# Patient Record
Sex: Female | Born: 1995 | Race: White | Hispanic: No | Smoking: Current every day smoker
Health system: Southern US, Community
[De-identification: ages and names within clinical notes are randomized; demographics above are authoritative.]

## PROBLEM LIST (undated history)

## (undated) ENCOUNTER — Inpatient Hospital Stay (HOSPITAL_COMMUNITY): Payer: Self-pay

## (undated) DIAGNOSIS — J45909 Unspecified asthma, uncomplicated: Secondary | ICD-10-CM

## (undated) DIAGNOSIS — F419 Anxiety disorder, unspecified: Secondary | ICD-10-CM

## (undated) DIAGNOSIS — M419 Scoliosis, unspecified: Secondary | ICD-10-CM

## (undated) HISTORY — DX: Scoliosis, unspecified: M41.9

## (undated) HISTORY — PX: COSMETIC SURGERY: SHX468

## (undated) HISTORY — PX: FRACTURE SURGERY: SHX138

## (undated) HISTORY — DX: Anxiety disorder, unspecified: F41.9

---
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
## 2014-06-16 LAB — HCG, QUANTITATIVE, PREGNANCY: HCG QUALITATIVE: 676.6

## 2014-08-08 NOTE — L&D Delivery Note (Cosign Needed)
Delivery Note At 1:15 PM a viable female was delivered via Vaginal, Spontaneous Delivery (Presentation: Left Occiput Anterior).  APGAR: 9, 9; weight 5 lb 11.2 oz (2586 g).  Loose nuchal cord, delivered through and reduced immediately after birth. Infant placed directly on mom's abdomen for bonding/skin-to-skin. Cord allowed to stop pulsing, and was clamped x 2, and cut by fob.  Very thin cord, didn't want to put any traction at all on it. Had pt push and then tried squatting in the bed w/ assistance while pushing, none of these attempts were successful in delivering placenta. Dr. Shawnie Pons was called for consult at s/p birth, recommended manual removal. Pt was given fentanyl iv x 1. Unsuccessful manual extraction attempt by me, then Dr. Shawnie Pons was able to successfully manual remove placenta, which came out in 3 pieces and looked to be complete.  Placenta status: Abnormal, Manual removal.  Cord: 3 vessels with the following complications: None.  Cord pH: n/a  Anesthesia: Epidural  Episiotomy: None Lacerations: 1st degree perineal, Lt labial Suture Repair: 3.0 vicryl Est. Blood Loss (mL):    Mom to postpartum.  Baby to Couplet care / Skin to Skin. Plans to breastfeed Condoms for contraception, doesn't want any form of hormonal contraception  [name] 03/07/2015, 2:41 PM

## 2014-08-15 ENCOUNTER — Inpatient Hospital Stay (HOSPITAL_COMMUNITY)
Admission: AD | Admit: 2014-08-15 | Payer: No Typology Code available for payment source | Source: Ambulatory Visit | Admitting: Obstetrics & Gynecology

## 2014-10-08 ENCOUNTER — Encounter: Payer: Self-pay | Admitting: *Deleted

## 2014-10-30 ENCOUNTER — Ambulatory Visit (INDEPENDENT_AMBULATORY_CARE_PROVIDER_SITE_OTHER): Payer: Medicaid Other | Admitting: Advanced Practice Midwife

## 2014-10-30 ENCOUNTER — Other Ambulatory Visit (HOSPITAL_COMMUNITY)
Admission: RE | Admit: 2014-10-30 | Discharge: 2014-10-30 | Disposition: A | Discharge status: Home / Self Care | Payer: Medicaid Other | Source: Ambulatory Visit | Attending: Advanced Practice Midwife | Admitting: Advanced Practice Midwife

## 2014-10-30 ENCOUNTER — Encounter: Payer: Self-pay | Admitting: Advanced Practice Midwife

## 2014-10-30 VITALS — BP 131/79 | HR 100 | Ht 60.0 in | Wt 192.1 lb

## 2014-10-30 DIAGNOSIS — Z124 Encounter for screening for malignant neoplasm of cervix: Secondary | ICD-10-CM

## 2014-10-30 DIAGNOSIS — M419 Scoliosis, unspecified: Secondary | ICD-10-CM

## 2014-10-30 DIAGNOSIS — Z113 Encounter for screening for infections with a predominantly sexual mode of transmission: Secondary | ICD-10-CM | POA: Insufficient documentation

## 2014-10-30 DIAGNOSIS — Z01419 Encounter for gynecological examination (general) (routine) without abnormal findings: Secondary | ICD-10-CM

## 2014-10-30 DIAGNOSIS — F410 Panic disorder [episodic paroxysmal anxiety] without agoraphobia: Secondary | ICD-10-CM

## 2014-10-30 DIAGNOSIS — F419 Anxiety disorder, unspecified: Secondary | ICD-10-CM | POA: Insufficient documentation

## 2014-10-30 DIAGNOSIS — Z118 Encounter for screening for other infectious and parasitic diseases: Secondary | ICD-10-CM

## 2014-10-30 DIAGNOSIS — T148XXA Other injury of unspecified body region, initial encounter: Secondary | ICD-10-CM

## 2014-10-30 DIAGNOSIS — O0932 Supervision of pregnancy with insufficient antenatal care, second trimester: Secondary | ICD-10-CM | POA: Insufficient documentation

## 2014-10-30 DIAGNOSIS — M1990 Unspecified osteoarthritis, unspecified site: Secondary | ICD-10-CM | POA: Insufficient documentation

## 2014-10-30 DIAGNOSIS — J45909 Unspecified asthma, uncomplicated: Secondary | ICD-10-CM | POA: Insufficient documentation

## 2014-10-30 LAB — POCT URINALYSIS DIP (DEVICE)
Bilirubin Urine: NEGATIVE
Glucose, UA: NEGATIVE mg/dL
HGB URINE DIPSTICK: NEGATIVE
Ketones, ur: NEGATIVE mg/dL
LEUKOCYTES UA: NEGATIVE
Nitrite: NEGATIVE
PH: 6 (ref 5.0–8.0)
PROTEIN: NEGATIVE mg/dL
SPECIFIC GRAVITY, URINE: 1.02 (ref 1.005–1.030)
Urobilinogen, UA: 0.2 mg/dL (ref 0.0–1.0)

## 2014-10-30 MED ORDER — HYDROXYZINE HCL 25 MG PO TABS: 25.0000 mg | ORAL_TABLET | Freq: Four times a day (QID) | ORAL | Status: DC | PRN

## 2014-10-30 NOTE — Progress Notes (Signed)
Pt reports bruising. Needs to return for early glucola.

## 2014-10-30 NOTE — Patient Instructions (Signed)
Second Trimester of Pregnancy The second trimester is from week 13 through week 28, months 4 through 6. The second trimester is often a time when you feel your best. Your body has also adjusted to being pregnant, and you begin to feel better physically. Usually, morning sickness has lessened or quit completely, you may have more energy, and you may have an increase in appetite. The second trimester is also a time when the fetus is growing rapidly. At the end of the sixth month, the fetus is about 9 inches long and weighs about 1 pounds. You will likely begin to feel the baby move (quickening) between 18 and 20 weeks of the pregnancy. BODY CHANGES Your body goes through many changes during pregnancy. The changes vary from woman to woman.   Your weight will continue to increase. You will notice your lower abdomen bulging out.  You may begin to get stretch marks on your hips, abdomen, and breasts.  You may develop headaches that can be relieved by medicines approved by your health care provider.  You may urinate more often because the fetus is pressing on your bladder.  You may develop or continue to have heartburn as a result of your pregnancy.  You may develop constipation because certain hormones are causing the muscles that push waste through your intestines to slow down.  You may develop hemorrhoids or swollen, bulging veins (varicose veins).  You may have back pain because of the weight gain and pregnancy hormones relaxing your joints between the bones in your pelvis and as a result of a shift in weight and the muscles that support your balance.  Your breasts will continue to grow and be tender.  Your gums may bleed and may be sensitive to brushing and flossing.  Dark spots or blotches (chloasma, mask of pregnancy) may develop on your face. This will likely fade after the baby is born.  A dark line from your belly button to the pubic area (linea nigra) may appear. This will likely fade  after the baby is born.  You may have changes in your hair. These can include thickening of your hair, rapid growth, and changes in texture. Some women also have hair loss during or after pregnancy, or hair that feels dry or thin. Your hair will most likely return to normal after your baby is born. WHAT TO EXPECT AT YOUR PRENATAL VISITS During a routine prenatal visit:  You will be weighed to make sure you and the fetus are growing normally.  Your blood pressure will be taken.  Your abdomen will be measured to track your baby's growth.  The fetal heartbeat will be listened to.  Any test results from the previous visit will be discussed. Your health care provider may ask you:  How you are feeling.  If you are feeling the baby move.  If you have had any abnormal symptoms, such as leaking fluid, bleeding, severe headaches, or abdominal cramping.  If you have any questions. Other tests that may be performed during your second trimester include:  Blood tests that check for:  Low iron levels (anemia).  Gestational diabetes (between 24 and 28 weeks).  Rh antibodies.  Urine tests to check for infections, diabetes, or protein in the urine.  An ultrasound to confirm the proper growth and development of the baby.  An amniocentesis to check for possible genetic problems.  Fetal screens for spina bifida and Down syndrome. HOME CARE INSTRUCTIONS   Avoid all smoking, herbs, alcohol, and unprescribed   drugs. These chemicals affect the formation and growth of the baby.  Follow your health care provider's instructions regarding medicine use. There are medicines that are either safe or unsafe to take during pregnancy.  Exercise only as directed by your health care provider. Experiencing uterine cramps is a good sign to stop exercising.  Continue to eat regular, healthy meals.  Wear a good support bra for breast tenderness.  Do not use hot tubs, steam rooms, or saunas.  Wear your  seat belt at all times when driving.  Avoid raw meat, uncooked cheese, cat litter boxes, and soil used by cats. These carry germs that can cause birth defects in the baby.  Take your prenatal vitamins.  Try taking a stool softener (if your health care provider approves) if you develop constipation. Eat more high-fiber foods, such as fresh vegetables or fruit and whole grains. Drink plenty of fluids to keep your urine clear or pale yellow.  Take warm sitz baths to soothe any pain or discomfort caused by hemorrhoids. Use hemorrhoid cream if your health care provider approves.  If you develop varicose veins, wear support hose. Elevate your feet for 15 minutes, 3-4 times a day. Limit salt in your diet.  Avoid heavy lifting, wear low heel shoes, and practice good posture.  Rest with your legs elevated if you have leg cramps or low back pain.  Visit your dentist if you have not gone yet during your pregnancy. Use a soft toothbrush to brush your teeth and be gentle when you floss.  A sexual relationship may be continued unless your health care provider directs you otherwise.  Continue to go to all your prenatal visits as directed by your health care provider. SEEK MEDICAL CARE IF:   You have dizziness.  You have mild pelvic cramps, pelvic pressure, or nagging pain in the abdominal area.  You have persistent nausea, vomiting, or diarrhea.  You have a bad smelling vaginal discharge.  You have pain with urination. SEEK IMMEDIATE MEDICAL CARE IF:   You have a fever.  You are leaking fluid from your vagina.  You have spotting or bleeding from your vagina.  You have severe abdominal cramping or pain.  You have rapid weight gain or loss.  You have shortness of breath with chest pain.  You notice sudden or extreme swelling of your face, hands, ankles, feet, or legs.  You have not felt your baby move in over an hour.  You have severe headaches that do not go away with  medicine.  You have vision changes. Document Released: 07/19/2001 Document Revised: 07/30/2013 Document Reviewed: 09/25/2012 Exit[employer] Patient Information 2015 [employer]. This information is not intended to replace advice given to you by your health care provider. Make sure you discuss any questions you have with your health care provider.  
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
## 2014-10-31 LAB — PRENATAL PROFILE (SOLSTAS)
Antibody Screen: NEGATIVE
BASOS PCT: 0 % (ref 0–1)
Basophils Absolute: 0 10*3/uL (ref 0.0–0.1)
EOS ABS: 0.5 10*3/uL (ref 0.0–0.7)
Eosinophils Relative: 3 % (ref 0–5)
HEMATOCRIT: 38.4 % (ref 36.0–46.0)
HEMOGLOBIN: 13.1 g/dL (ref 12.0–15.0)
HEP B S AG: NEGATIVE
HIV 1&2 Ab, 4th Generation: NONREACTIVE
LYMPHS ABS: 2.4 10*3/uL (ref 0.7–4.0)
Lymphocytes Relative: 15 % (ref 12–46)
MCH: 29 pg (ref 26.0–34.0)
MCHC: 34.1 g/dL (ref 30.0–36.0)
MCV: 85 fL (ref 78.0–100.0)
MONO ABS: 0.8 10*3/uL (ref 0.1–1.0)
MONOS PCT: 5 % (ref 3–12)
MPV: 10.3 fL (ref 8.6–12.4)
NEUTROS PCT: 77 % (ref 43–77)
Neutro Abs: 12.4 10*3/uL — ABNORMAL HIGH (ref 1.7–7.7)
Platelets: 359 10*3/uL (ref 150–400)
RBC: 4.52 MIL/uL (ref 3.87–5.11)
RDW: 14.7 % (ref 11.5–15.5)
RH TYPE: NEGATIVE
RUBELLA: 2.65 {index} — AB (ref <0.90)
WBC: 16.1 10*3/uL — ABNORMAL HIGH (ref 4.0–10.5)

## 2014-10-31 LAB — CULTURE, OB URINE
Colony Count: NO GROWTH
Organism ID, Bacteria: NO GROWTH

## 2014-11-01 LAB — PRESCRIPTION MONITORING PROFILE (19 PANEL)
Amphetamine/Meth: NEGATIVE ng/mL
BUPRENORPHINE, URINE: NEGATIVE ng/mL
Barbiturate Screen, Urine: NEGATIVE ng/mL
Benzodiazepine Screen, Urine: NEGATIVE ng/mL
CANNABINOID SCRN UR: NEGATIVE ng/mL
Carisoprodol, Urine: NEGATIVE ng/mL
Cocaine Metabolites: NEGATIVE ng/mL
Creatinine, Urine: 109.29 mg/dL (ref >20.0)
ECSTASY: NEGATIVE ng/mL
FENTANYL URINE: NEGATIVE ng/mL
METHADONE SCREEN, URINE: NEGATIVE ng/mL
Meperidine, Ur: NEGATIVE ng/mL
Methaqualone: NEGATIVE ng/mL
Nitrites, Initial: NEGATIVE ug/mL
OPIATE SCREEN, URINE: NEGATIVE ng/mL
OXYCODONE SCRN UR: NEGATIVE ng/mL
PHENCYCLIDINE, UR: NEGATIVE ng/mL
Propoxyphene: NEGATIVE ng/mL
TAPENTADOLUR: NEGATIVE ng/mL
TRAMADOL UR: NEGATIVE ng/mL
Zolpidem, Urine: NEGATIVE ng/mL
pH, Initial: 5.5 pH (ref 4.5–8.9)

## 2014-11-03 ENCOUNTER — Encounter (HOSPITAL_COMMUNITY): Payer: Self-pay | Admitting: Advanced Practice Midwife

## 2014-11-03 DIAGNOSIS — O26899 Other specified pregnancy related conditions, unspecified trimester: Secondary | ICD-10-CM

## 2014-11-03 DIAGNOSIS — Z6791 Unspecified blood type, Rh negative: Secondary | ICD-10-CM | POA: Insufficient documentation

## 2014-11-03 LAB — AFP, QUAD SCREEN
AFP: 98.5 ng/mL
CURR GEST AGE: 22.3 wks.days
HCG, Total: 25.47 IU/mL
INH: 493.4 pg/mL
Interpretation-AFP: NEGATIVE
MOM FOR INH: 2.39
MoM for AFP: 1.42
MoM for hCG: 1.46
Open Spina bifida: NEGATIVE
Osb Risk: 1:3470 {titer}
Tri 18 Scr Risk Est: NEGATIVE
Trisomy 18 (Edward) Syndrome Interp.: 1:149000 {titer}
uE3 Mom: 0.94
uE3 Value: 2.31 ng/mL

## 2014-11-04 LAB — CYTOLOGY - PAP

## 2014-11-07 ENCOUNTER — Ambulatory Visit (HOSPITAL_COMMUNITY)
Admission: RE | Admit: 2014-11-07 | Discharge: 2014-11-07 | Disposition: A | Discharge status: Home / Self Care | Payer: Medicaid Other | Source: Ambulatory Visit | Attending: Advanced Practice Midwife | Admitting: Advanced Practice Midwife

## 2014-11-07 ENCOUNTER — Other Ambulatory Visit: Payer: Self-pay | Admitting: Advanced Practice Midwife

## 2014-11-07 DIAGNOSIS — O0932 Supervision of pregnancy with insufficient antenatal care, second trimester: Secondary | ICD-10-CM | POA: Insufficient documentation

## 2014-11-07 DIAGNOSIS — Z3689 Encounter for other specified antenatal screening: Secondary | ICD-10-CM | POA: Insufficient documentation

## 2014-11-07 DIAGNOSIS — O0930 Supervision of pregnancy with insufficient antenatal care, unspecified trimester: Secondary | ICD-10-CM | POA: Insufficient documentation

## 2014-11-07 DIAGNOSIS — O99212 Obesity complicating pregnancy, second trimester: Secondary | ICD-10-CM | POA: Insufficient documentation

## 2014-11-27 ENCOUNTER — Encounter: Payer: Self-pay | Admitting: *Deleted

## 2014-11-27 ENCOUNTER — Ambulatory Visit (INDEPENDENT_AMBULATORY_CARE_PROVIDER_SITE_OTHER): Payer: Medicaid Other | Admitting: Advanced Practice Midwife

## 2014-11-27 VITALS — BP 128/68 | HR 106 | Temp 98.6°F | Wt 198.5 lb

## 2014-11-27 DIAGNOSIS — O9989 Other specified diseases and conditions complicating pregnancy, childbirth and the puerperium: Secondary | ICD-10-CM

## 2014-11-27 DIAGNOSIS — Z3492 Encounter for supervision of normal pregnancy, unspecified, second trimester: Secondary | ICD-10-CM

## 2014-11-27 DIAGNOSIS — J45909 Unspecified asthma, uncomplicated: Secondary | ICD-10-CM

## 2014-11-27 LAB — POCT URINALYSIS DIP (DEVICE)
BILIRUBIN URINE: NEGATIVE
GLUCOSE, UA: NEGATIVE mg/dL
Hgb urine dipstick: NEGATIVE
KETONES UR: NEGATIVE mg/dL
Nitrite: NEGATIVE
Protein, ur: NEGATIVE mg/dL
SPECIFIC GRAVITY, URINE: 1.015 (ref 1.005–1.030)
Urobilinogen, UA: 0.2 mg/dL (ref 0.0–1.0)
pH: 7 (ref 5.0–8.0)

## 2014-11-27 NOTE — Progress Notes (Signed)
Pt is currently taking steroids for asthma, needs to do 28 wk labs\ Leukocytes: Trace

## 2014-11-27 NOTE — Progress Notes (Signed)
Per perinatal education, patient has completed the Waterbirth Class.

## 2014-11-28 NOTE — Patient Instructions (Signed)
Second Trimester of Pregnancy The second trimester is from week 13 through week 28, months 4 through 6. The second trimester is often a time when you feel your best. Your body has also adjusted to being pregnant, and you begin to feel better physically. Usually, morning sickness has lessened or quit completely, you may have more energy, and you may have an increase in appetite. The second trimester is also a time when the fetus is growing rapidly. At the end of the sixth month, the fetus is about 9 inches long and weighs about 1 pounds. You will likely begin to feel the baby move (quickening) between 18 and 20 weeks of the pregnancy. BODY CHANGES Your body goes through many changes during pregnancy. The changes vary from woman to woman.   Your weight will continue to increase. You will notice your lower abdomen bulging out.  You may begin to get stretch marks on your hips, abdomen, and breasts.  You may develop headaches that can be relieved by medicines approved by your health care provider.  You may urinate more often because the fetus is pressing on your bladder.  You may develop or continue to have heartburn as a result of your pregnancy.  You may develop constipation because certain hormones are causing the muscles that push waste through your intestines to slow down.  You may develop hemorrhoids or swollen, bulging veins (varicose veins).  You may have back pain because of the weight gain and pregnancy hormones relaxing your joints between the bones in your pelvis and as a result of a shift in weight and the muscles that support your balance.  Your breasts will continue to grow and be tender.  Your gums may bleed and may be sensitive to brushing and flossing.  Dark spots or blotches (chloasma, mask of pregnancy) may develop on your face. This will likely fade after the baby is born.  A dark line from your belly button to the pubic area (linea nigra) may appear. This will likely fade  after the baby is born.  You may have changes in your hair. These can include thickening of your hair, rapid growth, and changes in texture. Some women also have hair loss during or after pregnancy, or hair that feels dry or thin. Your hair will most likely return to normal after your baby is born. WHAT TO EXPECT AT YOUR PRENATAL VISITS During a routine prenatal visit:  You will be weighed to make sure you and the fetus are growing normally.  Your blood pressure will be taken.  Your abdomen will be measured to track your baby's growth.  The fetal heartbeat will be listened to.  Any test results from the previous visit will be discussed. Your health care provider may ask you:  How you are feeling.  If you are feeling the baby move.  If you have had any abnormal symptoms, such as leaking fluid, bleeding, severe headaches, or abdominal cramping.  If you have any questions. Other tests that may be performed during your second trimester include:  Blood tests that check for:  Low iron levels (anemia).  Gestational diabetes (between 24 and 28 weeks).  Rh antibodies.  Urine tests to check for infections, diabetes, or protein in the urine.  An ultrasound to confirm the proper growth and development of the baby.  An amniocentesis to check for possible genetic problems.  Fetal screens for spina bifida and Down syndrome. HOME CARE INSTRUCTIONS   Avoid all smoking, herbs, alcohol, and unprescribed   drugs. These chemicals affect the formation and growth of the baby.  Follow your health care provider's instructions regarding medicine use. There are medicines that are either safe or unsafe to take during pregnancy.  Exercise only as directed by your health care provider. Experiencing uterine cramps is a good sign to stop exercising.  Continue to eat regular, healthy meals.  Wear a good support bra for breast tenderness.  Do not use hot tubs, steam rooms, or saunas.  Wear your  seat belt at all times when driving.  Avoid raw meat, uncooked cheese, cat litter boxes, and soil used by cats. These carry germs that can cause birth defects in the baby.  Take your prenatal vitamins.  Try taking a stool softener (if your health care provider approves) if you develop constipation. Eat more high-fiber foods, such as fresh vegetables or fruit and whole grains. Drink plenty of fluids to keep your urine clear or pale yellow.  Take warm sitz baths to soothe any pain or discomfort caused by hemorrhoids. Use hemorrhoid cream if your health care provider approves.  If you develop varicose veins, wear support hose. Elevate your feet for 15 minutes, 3-4 times a day. Limit salt in your diet.  Avoid heavy lifting, wear low heel shoes, and practice good posture.  Rest with your legs elevated if you have leg cramps or low back pain.  Visit your dentist if you have not gone yet during your pregnancy. Use a soft toothbrush to brush your teeth and be gentle when you floss.  A sexual relationship may be continued unless your health care provider directs you otherwise.  Continue to go to all your prenatal visits as directed by your health care provider. SEEK MEDICAL CARE IF:   You have dizziness.  You have mild pelvic cramps, pelvic pressure, or nagging pain in the abdominal area.  You have persistent nausea, vomiting, or diarrhea.  You have a bad smelling vaginal discharge.  You have pain with urination. SEEK IMMEDIATE MEDICAL CARE IF:   You have a fever.  You are leaking fluid from your vagina.  You have spotting or bleeding from your vagina.  You have severe abdominal cramping or pain.  You have rapid weight gain or loss.  You have shortness of breath with chest pain.  You notice sudden or extreme swelling of your face, hands, ankles, feet, or legs.  You have not felt your baby move in over an hour.  You have severe headaches that do not go away with  medicine.  You have vision changes. Document Released: 07/19/2001 Document Revised: 07/30/2013 Document Reviewed: 09/25/2012 Exit[employer] Patient Information 2015 [employer]. This information is not intended to replace advice given to you by your health care provider. Make sure you discuss any questions you have with your health care provider.  

## 2014-11-28 NOTE — Progress Notes (Signed)
Seen for asthma yesterday, states got steroid shots and prescribed DuoNeb. Has Rx for PO steroids, but has not started them yet. Reinforced followup for asthma. Needs Glucola at next visit. Need to be off steroids 1+ weeks prior to test. No wheezing now.

## 2014-12-03 ENCOUNTER — Inpatient Hospital Stay (HOSPITAL_COMMUNITY)
Admission: AD | Admit: 2014-12-03 | Discharge: 2014-12-03 | Disposition: A | Discharge status: Home / Self Care | Payer: Medicaid Other | Source: Ambulatory Visit | Attending: Obstetrics & Gynecology | Admitting: Obstetrics & Gynecology

## 2014-12-03 ENCOUNTER — Encounter (HOSPITAL_COMMUNITY): Payer: Self-pay | Admitting: *Deleted

## 2014-12-03 DIAGNOSIS — O9989 Other specified diseases and conditions complicating pregnancy, childbirth and the puerperium: Secondary | ICD-10-CM | POA: Insufficient documentation

## 2014-12-03 DIAGNOSIS — O99332 Smoking (tobacco) complicating pregnancy, second trimester: Secondary | ICD-10-CM | POA: Insufficient documentation

## 2014-12-03 DIAGNOSIS — N898 Other specified noninflammatory disorders of vagina: Secondary | ICD-10-CM

## 2014-12-03 DIAGNOSIS — R197 Diarrhea, unspecified: Secondary | ICD-10-CM | POA: Insufficient documentation

## 2014-12-03 DIAGNOSIS — F17210 Nicotine dependence, cigarettes, uncomplicated: Secondary | ICD-10-CM | POA: Diagnosis not present

## 2014-12-03 DIAGNOSIS — R109 Unspecified abdominal pain: Secondary | ICD-10-CM

## 2014-12-03 DIAGNOSIS — Z3A27 27 weeks gestation of pregnancy: Secondary | ICD-10-CM | POA: Insufficient documentation

## 2014-12-03 LAB — URINALYSIS, ROUTINE W REFLEX MICROSCOPIC
BILIRUBIN URINE: NEGATIVE
GLUCOSE, UA: NEGATIVE mg/dL
HGB URINE DIPSTICK: NEGATIVE
Ketones, ur: NEGATIVE mg/dL
Leukocytes, UA: NEGATIVE
Nitrite: NEGATIVE
Protein, ur: NEGATIVE mg/dL
SPECIFIC GRAVITY, URINE: 1.01 (ref 1.005–1.030)
Urobilinogen, UA: 0.2 mg/dL (ref 0.0–1.0)
pH: 5.5 (ref 5.0–8.0)

## 2014-12-03 LAB — WET PREP, GENITAL
CLUE CELLS WET PREP: NONE SEEN
Trich, Wet Prep: NONE SEEN
Yeast Wet Prep HPF POC: NONE SEEN

## 2014-12-03 NOTE — MAU Note (Signed)
Pt reports diarrhea and cramping x one week. States 3-4 loose stools per day. Denies fever. Also reports white discharge and states she feels "really wet" also the time.

## 2014-12-03 NOTE — MAU Provider Note (Signed)
History     CSN: [id number]  Arrival date and time: 12/03/14 4098   First Provider Initiated Contact with Patient 12/03/14 2020     Chief Complaint  Patient presents with  . Rupture of Membranes  . Diarrhea  . Abdominal Cramping  . Vaginal Discharge   HPI  Patient is 19 y.o. G1P0 [redacted]w[redacted]d here with complaints of diarrhea and cramping about 2 weeks ago.  Going to the bathroom no more than 2 times daily.  Denies fevers, chills, nausea, vomiting, cough, congestion, sore throat, runny nose.  Endorses asthma for which she takes albuterol and is on a short course of steroids.  Endorses vaginal discharge x1 week.  Denies odors or vaginal itching.  Denies undercooked foods, petting zoos, travel, sick contacts, hematochezia, rectal pain, postprandial pain.  Patient stays well hydrated.         +FM, denies LOF, VB, contractions  OB History    Gravida Para Term Preterm AB TAB SAB Ectopic Multiple Living   1               Past Medical History  Diagnosis Date  . Asthma   . Scoliosis     Past Surgical History  Procedure Laterality Date  . Fracture surgery    . Cosmetic surgery      Family History  Problem Relation Age of Onset  . Hypertension Father     History  Substance Use Topics  . Smoking status: Current Every Day Smoker -- 0.25 packs/day    Types: Cigarettes  . Smokeless tobacco: Never Used  . Alcohol Use: No    Allergies:  Allergies  Allergen Reactions  . Peanuts [Peanut Oil] Swelling  . Prednisone Other (See Comments)    Causes hostility.    Prescriptions prior to admission  Medication Sig Dispense Refill Last Dose  . albuterol (PROVENTIL HFA;VENTOLIN HFA) 108 (90 BASE) MCG/ACT inhaler Inhale 2 puffs into the lungs every 4 (four) hours as needed for wheezing or shortness of breath.   Past Week at Unknown time  . albuterol (PROVENTIL) (5 MG/ML) 0.5% nebulizer solution Take 2.5 mg by nebulization every 6 (six) hours as needed for wheezing or shortness of breath.    12/02/2014 at Unknown time  . diphenhydrAMINE (BENADRYL) 25 MG tablet Take 25 mg by mouth daily as needed for allergies.   12/02/2014 at Unknown time  . Fluticasone Furoate-Vilanterol 100-25 MCG/INH AEPB Inhale 1 puff into the lungs daily.   12/02/2014 at Unknown time  . hydrOXYzine (ATARAX/VISTARIL) 25 MG tablet Take 1 tablet (25 mg total) by mouth every 6 (six) hours as needed for itching. PRN anxiety/panic 30 tablet 2 12/02/2014 at Unknown time  . ipratropium-albuterol (DUONEB) 0.5-2.5 (3) MG/3ML SOLN Take 3 mLs by nebulization every 6 (six) hours as needed (For shortness of breath.).   12/02/2014 at Unknown time  . predniSONE (DELTASONE) 20 MG tablet Take 20 mg by mouth 2 (two) times daily with a meal.   12/03/2014 at Unknown time  . Prenatal Vit-Fe Fumarate-FA (PRENATAL MULTIVITAMIN) TABS tablet Take 1 tablet by mouth daily at 12 noon.   12/03/2014 at Unknown time  . cyclobenzaprine (FLEXERIL) 10 MG tablet Take 1 tablet (10 mg total) by mouth 2 (two) times daily as needed for muscle spasms. (Patient not taking: Reported on 12/03/2014) 20 tablet 0 Not Taking at Unknown time    Review of Systems  Constitutional: Negative for fever, chills and malaise/fatigue.  HENT: Negative for congestion and sore throat.   Respiratory: Positive  for wheezing. Negative for cough.   Gastrointestinal: Positive for abdominal pain (cramping) and diarrhea. Negative for nausea, vomiting, blood in stool and melena.  Genitourinary: Negative for dysuria, urgency, frequency and hematuria.  Musculoskeletal: Positive for falls (reports that she has a metal rod in her R leg.  has fallen on her butt).  Skin: Negative for itching.  Neurological: Negative for dizziness.  Psychiatric/Behavioral: Negative for depression. The patient has insomnia.    Physical Exam   Blood pressure 113/72, pulse 99, temperature 98.3 F (36.8 C), temperature source Oral, resp. rate 18, height 5' (1.524 m), weight 198 lb (89.812 kg), last menstrual  period 05/26/2014, SpO2 98 %.  Physical Exam  Constitutional: She is oriented to person, place, and time. She appears well-developed and well-nourished. No distress.  HENT:  Head: Normocephalic and atraumatic.  Eyes: EOM are normal. No scleral icterus.  Neck: Normal range of motion.  Cardiovascular: Normal rate, regular rhythm, normal heart sounds and intact distal pulses.   No murmur heard. Respiratory: Effort normal and breath sounds normal. She has no wheezes.  GI: Soft. Bowel sounds are normal. There is no rebound and no guarding.  gravid  Musculoskeletal: Normal range of motion. She exhibits no edema.  Lymphadenopathy:    She has no cervical adenopathy.  Neurological: She is alert and oriented to person, place, and time.  Skin: Skin is warm and dry. No rash noted.  Psychiatric: She has a normal mood and affect. Her behavior is normal. Thought content normal.   Dilation: Closed Effacement (%): Thick Cervical Position: Posterior Exam by:: Philipp DeputyKim Kenley Troop, CNM/Ashly Nadine CountsGottschalk, DO  Fetal monitoringBaseline: 137 bpm and Variability: Good {> 6 bpm) Uterine activity: uterine irritation  Results for orders placed or performed during the hospital encounter of 12/03/14 (from the past 24 hour(s))  Urinalysis, Routine w reflex microscopic     Status: None   Collection Time: 12/03/14  7:23 PM  Result Value Ref Range   Color, Urine YELLOW YELLOW   APPearance CLEAR CLEAR   Specific Gravity, Urine 1.010 1.005 - 1.030   pH 5.5 5.0 - 8.0   Glucose, UA NEGATIVE NEGATIVE mg/dL   Hgb urine dipstick NEGATIVE NEGATIVE   Bilirubin Urine NEGATIVE NEGATIVE   Ketones, ur NEGATIVE NEGATIVE mg/dL   Protein, ur NEGATIVE NEGATIVE mg/dL   Urobilinogen, UA 0.2 0.0 - 1.0 mg/dL   Nitrite NEGATIVE NEGATIVE   Leukocytes, UA NEGATIVE NEGATIVE  Wet prep, genital     Status: Abnormal   Collection Time: 12/03/14  8:20 PM  Result Value Ref Range   Yeast Wet Prep HPF POC NONE SEEN NONE SEEN   Trich, Wet Prep  NONE SEEN NONE SEEN   Clue Cells Wet Prep HPF POC NONE SEEN NONE SEEN   WBC, Wet Prep HPF POC FEW (A) NONE SEEN    MAU Course  Procedures  MDM  NST reassuring, uterine irritation on toco UA and wet prep unremarkable GC/CT obtained  Assessment and Plan  Diarrhea.  Likely viral in nature.  No red flags.  Patient appears well on exam. -Reassurance -Encouraged PO hydration -Return precautions discussed  Leukorrhea of pregnancy -Reassurance -Follow up with OB as scheduled  Delynn FlavinGottschalk, Ashly M, DO 12/03/2014, 9:05 PM   CNM attestation:  I have seen and examined this patient; I agree with above documentation in the resident's note.   Lorraine Walker is a 19 y.o. G1P0 reporting diarrhea/soft stools and abd cramping +FM, denies LOF, VB, contractions, vaginal discharge.  PE: BP 118/65 mmHg  Pulse 99  Temp(Src) 98.3 F (36.8 C) (Oral)  Resp 18  Ht 5' (1.524 m)  Wt 89.812 kg (198 lb)  BMI 38.67 kg/m2  SpO2 98%  LMP 05/26/2014 (Exact Date) Gen: calm comfortable, NAD Resp: normal effort, no distress Abd: gravid  ROS, labs, PMH reviewed NST approp for GA Toco w/ uterine irritability  Plan: -preterm labor precautions - reinforced PO hydration - continue routine follow up in OB clinic as scheduled  Cam Hai, CNM 10:51 PM

## 2014-12-03 NOTE — MAU Note (Signed)
Pt reports diarrhea for one week. Pt states that the baby isn't moving as much as she used to. Pt also states that she is having some white vaginal discharge.

## 2014-12-03 NOTE — Discharge Instructions (Signed)
Abdominal Pain During Pregnancy Belly (abdominal) pain is common during pregnancy. Most of the time, it is not a serious problem. Other times, it can be a sign that something is wrong with the pregnancy. Always tell your doctor if you have belly pain. HOME CARE Monitor your belly pain for any changes. The following actions may help you feel better:  Do not have sex (intercourse) or put anything in your vagina until you feel better.  Rest until your pain stops.  Drink clear fluids if you feel sick to your stomach (nauseous). Do not eat solid food until you feel better.  Only take medicine as told by your doctor.  Keep all doctor visits as told. GET HELP RIGHT AWAY IF:   You are bleeding, leaking fluid, or pieces of tissue come out of your vagina.  You have more pain or cramping.  You keep throwing up (vomiting).  You have pain when you pee (urinate) or have blood in your pee.  You have a fever.  You do not feel your baby moving as much.  You feel very weak or feel like passing out.  You have trouble breathing, with or without belly pain.  You have a very bad headache and belly pain.  You have fluid leaking from your vagina and belly pain.  You keep having watery poop (diarrhea).  Your belly pain does not go away after resting, or the pain gets worse. MAKE SURE YOU:   Understand these instructions.  Will watch your condition.  Will get help right away if you are not doing well or get worse. Document Released: 07/13/2009 Document Revised: 03/27/2013 Document Reviewed: 02/21/2013 [facility] Patient Information 2015 [place], [place]. This information is not intended to replace advice given to you by your health care provider. Make sure you discuss any questions you have with your health care provider.  Diarrhea Diarrhea is watery poop (stool). It can make you feel weak, tired, thirsty, or give you a dry mouth (signs of dehydration). Watery poop is a sign of another problem,  most often an infection. It often lasts 2-3 days. It can last longer if it is a sign of something serious. Take care of yourself as told by your doctor. HOME CARE   Drink 1 cup (8 ounces) of fluid each time you have watery poop.  Do not drink the following fluids:  Those that contain simple sugars (fructose, glucose, galactose, lactose, sucrose, maltose).  Sports drinks.  Fruit juices.  Whole milk products.  Sodas.  Drinks with caffeine (coffee, tea, soda) or alcohol.  Oral rehydration solution may be used if the doctor says it is okay. You may make your own solution. Follow this recipe:   - teaspoon table salt.   teaspoon baking soda.   teaspoon salt substitute containing potassium chloride.  1 tablespoons sugar.  1 liter (34 ounces) of water.  Avoid the following foods:  High fiber foods, such as raw fruits and vegetables.  Nuts, seeds, and whole grain breads and cereals.   Those that are sweetened with sugar alcohols (xylitol, sorbitol, mannitol).  Try eating the following foods:  Starchy foods, such as rice, toast, pasta, low-sugar cereal, oatmeal, baked potatoes, crackers, and bagels.  Bananas.  Applesauce.  Eat probiotic-rich foods, such as yogurt and milk products that are fermented.  Wash your hands well after each time you have watery poop.  Only take medicine as told by your doctor.  Take a warm bath to help lessen burning or pain from having  watery poop. GET HELP RIGHT AWAY IF:   You cannot drink fluids without throwing up (vomiting).  You keep throwing up.  You have blood in your poop, or your poop looks black and tarry.  You do not pee (urinate) in 6-8 hours, or there is only a small amount of very dark pee.  You have belly (abdominal) pain that gets worse or stays in the same spot (localizes).  You are weak, dizzy, confused, or light-headed.  You have a very bad headache.  Your watery poop gets worse or does not get better.  You  have a fever or lasting symptoms for more than 2-3 days.  You have a fever and your symptoms suddenly get worse. MAKE SURE YOU:   Understand these instructions.  Will watch your condition.  Will get help right away if you are not doing well or get worse. Document Released: 01/11/2008 Document Revised: 12/09/2013 Document Reviewed: 04/01/2012 [facility] Patient Information 2015 [place], [place]. This information is not intended to replace advice given to you by your health care provider. Make sure you discuss any questions you have with your health care provider.

## 2014-12-04 LAB — GC/CHLAMYDIA PROBE AMP (~~LOC~~) NOT AT ARMC
CHLAMYDIA, DNA PROBE: NEGATIVE
Neisseria Gonorrhea: NEGATIVE

## 2014-12-10 ENCOUNTER — Encounter: Payer: Medicaid Other | Admitting: Advanced Practice Midwife

## 2014-12-22 ENCOUNTER — Encounter: Payer: Medicaid Other | Admitting: Family Medicine

## 2014-12-22 ENCOUNTER — Encounter: Payer: Self-pay | Admitting: Family Medicine

## 2014-12-23 ENCOUNTER — Encounter: Payer: Self-pay | Admitting: *Deleted

## 2015-01-15 ENCOUNTER — Ambulatory Visit (INDEPENDENT_AMBULATORY_CARE_PROVIDER_SITE_OTHER): Payer: Medicaid Other | Admitting: Family Medicine

## 2015-01-15 VITALS — BP 115/67 | HR 104 | Temp 98.9°F | Wt 213.8 lb

## 2015-01-15 DIAGNOSIS — O360130 Maternal care for anti-D [Rh] antibodies, third trimester, not applicable or unspecified: Secondary | ICD-10-CM | POA: Diagnosis not present

## 2015-01-15 DIAGNOSIS — O360131 Maternal care for anti-D [Rh] antibodies, third trimester, fetus 1: Secondary | ICD-10-CM

## 2015-01-15 DIAGNOSIS — O99332 Smoking (tobacco) complicating pregnancy, second trimester: Secondary | ICD-10-CM | POA: Diagnosis not present

## 2015-01-15 DIAGNOSIS — Z23 Encounter for immunization: Secondary | ICD-10-CM | POA: Diagnosis not present

## 2015-01-15 DIAGNOSIS — O99330 Smoking (tobacco) complicating pregnancy, unspecified trimester: Secondary | ICD-10-CM

## 2015-01-15 DIAGNOSIS — O0932 Supervision of pregnancy with insufficient antenatal care, second trimester: Secondary | ICD-10-CM | POA: Diagnosis present

## 2015-01-15 MED ORDER — TETANUS-DIPHTH-ACELL PERTUSSIS 5-2.5-18.5 LF-MCG/0.5 IM SUSP
0.5000 mL | Freq: Once | INTRAMUSCULAR | Status: AC
  Administered 2015-01-15: 0.5 mL via INTRAMUSCULAR

## 2015-01-15 MED ORDER — RHO D IMMUNE GLOBULIN 1500 UNIT/2ML IJ SOSY
300.0000 ug | PREFILLED_SYRINGE | Freq: Once | INTRAMUSCULAR | Status: AC
  Administered 2015-01-15: 300 ug via INTRAMUSCULAR

## 2015-01-15 NOTE — Addendum Note (Signed)
Addended by: Gerome Apley on: 01/15/2015 03:04 PM   Modules accepted: Orders

## 2015-01-15 NOTE — Patient Instructions (Signed)

## 2015-01-15 NOTE — Progress Notes (Signed)
Patient is 19 y.o. G1P0 [redacted]w[redacted]d.  +FM, denies LOF, VB, contractions, vaginal discharge.  Overall feeling well. - O neg => Rhogam today, did not receive previously as has missed last 2 appts - growth sono, sono at 23w but growth 6wk advised 2/2 LTC - Tdap today - has 2L of Dr. Reino Kent today in clinic, advised to not drink this much soda and to choose healthier options such as water.

## 2015-01-15 NOTE — Progress Notes (Signed)
Discussed breast feeding tip of the week.  

## 2015-01-16 LAB — POCT URINALYSIS DIP (DEVICE)
BILIRUBIN URINE: NEGATIVE
Glucose, UA: NEGATIVE mg/dL
Hgb urine dipstick: NEGATIVE
Ketones, ur: NEGATIVE mg/dL
LEUKOCYTES UA: NEGATIVE
Nitrite: NEGATIVE
PH: 7 (ref 5.0–8.0)
PROTEIN: NEGATIVE mg/dL
SPECIFIC GRAVITY, URINE: 1.015 (ref 1.005–1.030)
Urobilinogen, UA: 0.2 mg/dL (ref 0.0–1.0)

## 2015-01-19 ENCOUNTER — Ambulatory Visit (HOSPITAL_COMMUNITY)
Admission: RE | Admit: 2015-01-19 | Discharge: 2015-01-19 | Disposition: A | Discharge status: Home / Self Care | Payer: Medicaid Other | Source: Ambulatory Visit | Attending: Family Medicine | Admitting: Family Medicine

## 2015-01-19 DIAGNOSIS — Z3A34 34 weeks gestation of pregnancy: Secondary | ICD-10-CM | POA: Insufficient documentation

## 2015-01-19 DIAGNOSIS — O0932 Supervision of pregnancy with insufficient antenatal care, second trimester: Secondary | ICD-10-CM | POA: Insufficient documentation

## 2015-01-29 ENCOUNTER — Ambulatory Visit (INDEPENDENT_AMBULATORY_CARE_PROVIDER_SITE_OTHER): Payer: Medicaid Other | Admitting: Family

## 2015-01-29 ENCOUNTER — Ambulatory Visit (HOSPITAL_COMMUNITY)
Admission: RE | Admit: 2015-01-29 | Discharge: 2015-01-29 | Disposition: A | Discharge status: Home / Self Care | Payer: Medicaid Other | Source: Ambulatory Visit | Attending: Family | Admitting: Family

## 2015-01-29 VITALS — BP 125/83 | HR 105 | Temp 98.5°F | Wt 210.3 lb

## 2015-01-29 DIAGNOSIS — O403XX0 Polyhydramnios, third trimester, not applicable or unspecified: Secondary | ICD-10-CM | POA: Diagnosis not present

## 2015-01-29 DIAGNOSIS — O0932 Supervision of pregnancy with insufficient antenatal care, second trimester: Secondary | ICD-10-CM | POA: Diagnosis not present

## 2015-01-29 DIAGNOSIS — O409XX0 Polyhydramnios, unspecified trimester, not applicable or unspecified: Secondary | ICD-10-CM | POA: Insufficient documentation

## 2015-01-29 DIAGNOSIS — O3663X0 Maternal care for excessive fetal growth, third trimester, not applicable or unspecified: Secondary | ICD-10-CM | POA: Diagnosis not present

## 2015-01-29 DIAGNOSIS — Z3A35 35 weeks gestation of pregnancy: Secondary | ICD-10-CM | POA: Diagnosis not present

## 2015-01-29 DIAGNOSIS — O0933 Supervision of pregnancy with insufficient antenatal care, third trimester: Secondary | ICD-10-CM | POA: Insufficient documentation

## 2015-01-29 DIAGNOSIS — O289 Unspecified abnormal findings on antenatal screening of mother: Secondary | ICD-10-CM | POA: Insufficient documentation

## 2015-01-29 LAB — POCT URINALYSIS DIP (DEVICE)
Bilirubin Urine: NEGATIVE
Glucose, UA: NEGATIVE mg/dL
HGB URINE DIPSTICK: NEGATIVE
Ketones, ur: NEGATIVE mg/dL
Nitrite: NEGATIVE
PROTEIN: NEGATIVE mg/dL
SPECIFIC GRAVITY, URINE: 1.02 (ref 1.005–1.030)
UROBILINOGEN UA: 0.2 mg/dL (ref 0.0–1.0)
pH: 6.5 (ref 5.0–8.0)

## 2015-01-29 NOTE — Progress Notes (Signed)
C/o occasional pelvic pressure.  Noted several scabs on belly and near belly buttons-- patient reports constant itching without relieve from cortisone.

## 2015-01-29 NOTE — Progress Notes (Signed)
Subjective:  Lorraine Walker is a 19 y.o. G1P0 at [redacted]w[redacted]d being seen today for ongoing prenatal care.  Patient reports reports increased pelvic pressure x one day, none now.  Also reports +abdominal itching x 2 weeks on stretch marks, no itching on palms of hands or feet.  .  Contractions: Not present.  Vag. Bleeding: None. Movement: Present. Denies leaking of fluid. Upon review of the records pt had subjectively high amniotic fluid on 6/12.  Has not had 1 hr test to date.    The following portions of the patient's history were reviewed and updated as appropriate: allergies, current medications, past family history, past medical history, past social history, past surgical history and problem list.   Objective:   Filed Vitals:   01/29/15 1402  BP: 125/83  Pulse: 105  Temp: 98.5 F (36.9 C)  Weight: 210 lb 4.8 oz (95.391 kg)    Fetal Status: Fetal Heart Rate (bpm): 150   Movement: Present     General:  Alert, oriented and cooperative. Patient is in no acute distress.  Skin: Skin is warm and dry. No rash noted.   Cardiovascular: Normal heart rate noted  Respiratory: Effort and breath sounds normal, no problems with respiration noted  Abdomen: Soft, gravid, appropriate for gestational age. Pain/Pressure: Present; excoriations on abdomen   Vaginal: Vag. Bleeding: None.       Extremities: Normal range of motion.  Edema: None  Mental Status: Normal mood and affect. Normal behavior. Normal judgment and thought content.   Urinalysis:      Assessment and Plan:  Pregnancy: G1P0 at [redacted]w[redacted]d   1. Late prenatal care affecting pregnancy in second trimester, antepartum  2.  Subjectively high AFI - missed 1 hour - obtain 1 hr; random CBG today (pt eating chicken tenders, biscuit) > will come back tomorrow for 1 hr.   - AFI today  Please refer to After Visit Summary for other counseling recommendations.   Follow-up will be determined by AFI  Marlis Edelson, CNM

## 2015-01-29 NOTE — Patient Instructions (Signed)
AREA PEDIATRIC/FAMILY PRACTICE PHYSICIANS  [facility] [address] [place], [place] [address] Phone - [phone]   Fax - [phone]  [facility] [address] [place], [place]  [address] Phone - [phone]   Fax - [phone]  [facility] [address] [place], [place]  [address] Phone - [phone]   Fax - [phone]  [facility] [address] [place], [place]  [address] Phone - [phone]   Fax - [phone]  [facility] [address] [place], [place]  [address] Phone - [phone]   Fax - [phone]  [facility] [address] [place], [place]  [address] Phone - [phone]   Fax - [phone]  [facility] [address] [place], [place]  [address] Phone - [phone]   Fax - [phone]  [facility] [address] [place], [place]  [address] Phone - [phone]   Fax - [phone]  [facility] [address] [place], [place]  [address] Phone - [phone]   Fax - [phone] [facility] [address] [place], [place]  [address] Phone - [phone]   Fax - [phone]  [facility] [address] [place], [place]  [address] Phone - [phone]   Fax - [phone]  [facility] [address] [place], [place]  [address] Phone - [phone]   Fax - [phone]  [facility] [address] [place], [place]  [address] Phone - [phone]   Fax - [phone]  [facility] [address] [place], [place]  [address] Phone - [phone]  [facility] [address] [place], [place]  [address] Phone - [phone]   Fax - [phone]  [facility] [address] [place], [place]  [address] Phone - [phone]   Fax - [phone]  [facility] [address] [place], [place]  [address] Phone - [phone]   Fax - [phone]  [facility] [address] [place], [place]  [address] Phone - [phone]   Fax - [phone]   [facility] [address] [place], [place]  [address] Phone - [phone]   Fax - [phone]  [facility] [address] , [place]  [address] Phone - [phone]   Fax - [phone]  [facility] [address] , [place]  [address] Phone - [phone]   Fax - [phone]  [facility] [address] , [place]  [address] Phone - [phone]   Fax - [phone]  [facility] [address] , [place]  [address] Phone - [phone]   Fax - [phone] [facility] [address] [place], [place]  [address] Phone - [phone]   Fax - [phone]  [facility] [address] [place], [place]  [address] Phone - [phone]   Fax - [phone]  [facility] [address] [place],   [address] Phone - [phone]   Fax - [phone]   

## 2015-01-30 ENCOUNTER — Other Ambulatory Visit: Payer: Medicaid Other

## 2015-01-30 DIAGNOSIS — O0933 Supervision of pregnancy with insufficient antenatal care, third trimester: Secondary | ICD-10-CM

## 2015-01-30 LAB — CBC
HCT: 35.2 % — ABNORMAL LOW (ref 36.0–46.0)
HEMOGLOBIN: 11.7 g/dL — AB (ref 12.0–15.0)
MCH: 28.1 pg (ref 26.0–34.0)
MCHC: 33.2 g/dL (ref 30.0–36.0)
MCV: 84.6 fL (ref 78.0–100.0)
MPV: 11.8 fL (ref 8.6–12.4)
Platelets: 312 10*3/uL (ref 150–400)
RBC: 4.16 MIL/uL (ref 3.87–5.11)
RDW: 13.9 % (ref 11.5–15.5)
WBC: 16.7 10*3/uL — ABNORMAL HIGH (ref 4.0–10.5)

## 2015-01-31 LAB — GLUCOSE TOLERANCE, 1 HOUR (50G) W/O FASTING: GLUCOSE 1 HOUR GTT: 97 mg/dL (ref 70–140)

## 2015-01-31 LAB — RPR

## 2015-01-31 LAB — HIV ANTIBODY (ROUTINE TESTING W REFLEX): HIV 1&2 Ab, 4th Generation: NONREACTIVE

## 2015-02-04 ENCOUNTER — Ambulatory Visit (INDEPENDENT_AMBULATORY_CARE_PROVIDER_SITE_OTHER): Payer: Medicaid Other | Admitting: Family

## 2015-02-04 ENCOUNTER — Other Ambulatory Visit: Payer: Self-pay | Admitting: Family

## 2015-02-04 VITALS — BP 139/84 | HR 120 | Temp 98.5°F | Wt 214.0 lb

## 2015-02-04 DIAGNOSIS — O0932 Supervision of pregnancy with insufficient antenatal care, second trimester: Secondary | ICD-10-CM

## 2015-02-04 LAB — POCT URINALYSIS DIP (DEVICE)
Bilirubin Urine: NEGATIVE
Glucose, UA: NEGATIVE mg/dL
Hgb urine dipstick: NEGATIVE
KETONES UR: NEGATIVE mg/dL
LEUKOCYTES UA: NEGATIVE
Nitrite: NEGATIVE
Protein, ur: NEGATIVE mg/dL
Specific Gravity, Urine: 1.015 (ref 1.005–1.030)
Urobilinogen, UA: 0.2 mg/dL (ref 0.0–1.0)
pH: 7 (ref 5.0–8.0)

## 2015-02-04 LAB — OB RESULTS CONSOLE GBS: GBS: NEGATIVE

## 2015-02-04 LAB — OB RESULTS CONSOLE GC/CHLAMYDIA
Chlamydia: NEGATIVE
Gonorrhea: NEGATIVE

## 2015-02-04 NOTE — Progress Notes (Signed)
Subjective:   Lorraine Walker  is a 19 y.o.  female being seen today for her obstetrical visit. She is at 5325w2d . Patient reports continued abdominal itching, no hand or feet involvement. Fetal movement: normal.  Menstrual History: OB History    Gravida Para Term Preterm AB TAB SAB Ectopic Multiple Living   1               The following portions of the patient's history were reviewed and updated as appropriate: allergies, current medications, past family history, past medical history, past social history, past surgical history and problem list.  Review of Systems Constitutional: negative Genitourinary:negative; pt denies vaginal bleeding or leaking of fluid.  Reports occasional contractions.     Objective:   Filed Vitals:   02/04/15 1059  BP: 139/84  Pulse: 120  Temp: 98.5 F (36.9 C)    FHT:  154 BPM  Uterine Size: 36 cm        Assessment:   G1P0  at 1025w2d wks   Plan:   28-week labs reviewed, normal Reviewed ultrasound results > normal high AFI, follow-up ultrasound as clinically indicated Follow up in 1 week.   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  Pt states fully mobile, will not impact ability to get in and out of tub.

## 2015-02-04 NOTE — Progress Notes (Signed)
Reviewed tip of week with patient  

## 2015-02-04 NOTE — Addendum Note (Signed)
Addended by: Louanna RawAMPBELL, Khylei Wilms M on: 02/04/2015 01:41 PM   Modules accepted: Orders

## 2015-02-05 LAB — GC/CHLAMYDIA PROBE AMP
CT PROBE, AMP APTIMA: NEGATIVE
GC Probe RNA: NEGATIVE

## 2015-02-06 LAB — CULTURE, BETA STREP (GROUP B ONLY)

## 2015-02-11 ENCOUNTER — Ambulatory Visit (INDEPENDENT_AMBULATORY_CARE_PROVIDER_SITE_OTHER): Payer: Medicaid Other | Admitting: Advanced Practice Midwife

## 2015-02-11 VITALS — BP 139/67 | HR 97 | Temp 99.0°F | Wt 215.1 lb

## 2015-02-11 DIAGNOSIS — O0932 Supervision of pregnancy with insufficient antenatal care, second trimester: Secondary | ICD-10-CM | POA: Diagnosis present

## 2015-02-11 LAB — POCT URINALYSIS DIP (DEVICE)
Bilirubin Urine: NEGATIVE
Glucose, UA: NEGATIVE mg/dL
HGB URINE DIPSTICK: NEGATIVE
Ketones, ur: NEGATIVE mg/dL
Leukocytes, UA: NEGATIVE
NITRITE: NEGATIVE
PH: 5.5 (ref 5.0–8.0)
PROTEIN: NEGATIVE mg/dL
Specific Gravity, Urine: 1.02 (ref 1.005–1.030)
UROBILINOGEN UA: 0.2 mg/dL (ref 0.0–1.0)

## 2015-02-11 NOTE — Progress Notes (Signed)
GBS-neg

## 2015-02-11 NOTE — Patient Instructions (Signed)
Braxton Hicks Contractions °Contractions of the uterus can occur throughout pregnancy. Contractions are not always a sign that you are in labor.  °WHAT ARE BRAXTON HICKS CONTRACTIONS?  °Contractions that occur before labor are called Braxton Hicks contractions, or false labor. Toward the end of pregnancy (32-34 weeks), these contractions can develop more often and may become more forceful. This is not true labor because these contractions do not result in opening (dilatation) and thinning of the cervix. They are sometimes difficult to tell apart from true labor because these contractions can be forceful and people have different pain tolerances. You should not feel embarrassed if you go to the hospital with false labor. Sometimes, the only way to tell if you are in true labor is for your health care provider to look for changes in the cervix. °If there are no prenatal problems or other health problems associated with the pregnancy, it is completely safe to be sent home with false labor and await the onset of true labor. °HOW CAN YOU TELL THE DIFFERENCE BETWEEN TRUE AND FALSE LABOR? °False Labor °· The contractions of false labor are usually shorter and not as hard as those of true labor.   °· The contractions are usually irregular.   °· The contractions are often felt in the front of the lower abdomen and in the groin.   °· The contractions may go away when you walk around or change positions while lying down.   °· The contractions get weaker and are shorter lasting as time goes on.   °· The contractions do not usually become progressively stronger, regular, and closer together as with true labor.   °True Labor °1. Contractions in true labor last 30-70 seconds, become very regular, usually become more intense, and increase in frequency.   °2. The contractions do not go away with walking.   °3. The discomfort is usually felt in the top of the uterus and spreads to the lower abdomen and low back.   °4. True labor can  be determined by your health care provider with an exam. This will show that the cervix is dilating and getting thinner.   °WHAT TO REMEMBER °· Keep up with your usual exercises and follow other instructions given by your health care provider.   °· Take medicines as directed by your health care provider.   °· Keep your regular prenatal appointments.   °· Eat and drink lightly if you think you are going into labor.   °· If Braxton Hicks contractions are making you uncomfortable:   °· Change your position from lying down or resting to walking, or from walking to resting.   °· Sit and rest in a tub of warm water.   °· Drink 2-3 glasses of water. Dehydration may cause these contractions.   °· Do slow and deep breathing several times an hour.   °WHEN SHOULD I SEEK IMMEDIATE MEDICAL CARE? °Seek immediate medical care if: °· Your contractions become stronger, more regular, and closer together.   °· You have fluid leaking or gushing from your vagina.   °· You have a fever.   °· You pass blood-tinged mucus.   °· You have vaginal bleeding.   °· You have continuous abdominal pain.   °· You have low back pain that you never had before.   °· You feel your baby's head pushing down and causing pelvic pressure.   °· Your baby is not moving as much as it used to.   °Document Released: 07/25/2005 Document Revised: 07/30/2013 Document Reviewed: 05/06/2013 °[employer]® Patient Information ©2015 [employer]. This information is not intended to replace advice given to you by your health care   provider. Make sure you discuss any questions you have with your health care provider. ° °Fetal Movement Counts °Patient Name: __________________________________________________ Patient Due Date: ____________________ °Performing a fetal movement count is highly recommended in high-risk pregnancies, but it is good for every pregnant woman to do. Your health care provider may ask you to start counting fetal movements at 28 weeks of the pregnancy. Fetal  movements often increase: °· After eating a full meal. °· After physical activity. °· After eating or drinking something sweet or cold. °· At rest. °Pay attention to when you feel the baby is most active. This will help you notice a pattern of your baby's sleep and wake cycles and what factors contribute to an increase in fetal movement. It is important to perform a fetal movement count at the same time each day when your baby is normally most active.  °HOW TO COUNT FETAL MOVEMENTS °5. Find a quiet and comfortable area to sit or lie down on your left side. Lying on your left side provides the best blood and oxygen circulation to your baby. °6. Write down the day and time on a sheet of paper or in a journal. °7. Start counting kicks, flutters, swishes, rolls, or jabs in a 2-hour period. You should feel at least 10 movements within 2 hours. °8. If you do not feel 10 movements in 2 hours, wait 2-3 hours and count again. Look for a change in the pattern or not enough counts in 2 hours. °SEEK MEDICAL CARE IF: °· You feel less than 10 counts in 2 hours, tried twice. °· There is no movement in over an hour. °· The pattern is changing or taking longer each day to reach 10 counts in 2 hours. °· You feel the baby is not moving as he or she usually does. °Date: ____________ Movements: ____________ Start time: ____________ Finish time: ____________  °Date: ____________ Movements: ____________ Start time: ____________ Finish time: ____________ °Date: ____________ Movements: ____________ Start time: ____________ Finish time: ____________ °Date: ____________ Movements: ____________ Start time: ____________ Finish time: ____________ °Date: ____________ Movements: ____________ Start time: ____________ Finish time: ____________ °Date: ____________ Movements: ____________ Start time: ____________ Finish time: ____________ °Date: ____________ Movements: ____________ Start time: ____________ Finish time: ____________ °Date: ____________  Movements: ____________ Start time: ____________ Finish time: ____________  °Date: ____________ Movements: ____________ Start time: ____________ Finish time: ____________ °Date: ____________ Movements: ____________ Start time: ____________ Finish time: ____________ °Date: ____________ Movements: ____________ Start time: ____________ Finish time: ____________ °Date: ____________ Movements: ____________ Start time: ____________ Finish time: ____________ °Date: ____________ Movements: ____________ Start time: ____________ Finish time: ____________ °Date: ____________ Movements: ____________ Start time: ____________ Finish time: ____________ °Date: ____________ Movements: ____________ Start time: ____________ Finish time: ____________  °Date: ____________ Movements: ____________ Start time: ____________ Finish time: ____________ °Date: ____________ Movements: ____________ Start time: ____________ Finish time: ____________ °Date: ____________ Movements: ____________ Start time: ____________ Finish time: ____________ °Date: ____________ Movements: ____________ Start time: ____________ Finish time: ____________ °Date: ____________ Movements: ____________ Start time: ____________ Finish time: ____________ °Date: ____________ Movements: ____________ Start time: ____________ Finish time: ____________ °Date: ____________ Movements: ____________ Start time: ____________ Finish time: ____________  °Date: ____________ Movements: ____________ Start time: ____________ Finish time: ____________ °Date: ____________ Movements: ____________ Start time: ____________ Finish time: ____________ °Date: ____________ Movements: ____________ Start time: ____________ Finish time: ____________ °Date: ____________ Movements: ____________ Start time: ____________ Finish time: ____________ °Date: ____________ Movements: ____________ Start time: ____________ Finish time: ____________ °Date: ____________ Movements: ____________ Start time:  ____________ Finish time: ____________ °Date: ____________ Movements:   ____________ Start time: ____________ Finish time: ____________  °Date: ____________ Movements: ____________ Start time: ____________ Finish time: ____________ °Date: ____________ Movements: ____________ Start time: ____________ Finish time: ____________ °Date: ____________ Movements: ____________ Start time: ____________ Finish time: ____________ °Date: ____________ Movements: ____________ Start time: ____________ Finish time: ____________ °Date: ____________ Movements: ____________ Start time: ____________ Finish time: ____________ °Date: ____________ Movements: ____________ Start time: ____________ Finish time: ____________ °Date: ____________ Movements: ____________ Start time: ____________ Finish time: ____________  °Date: ____________ Movements: ____________ Start time: ____________ Finish time: ____________ °Date: ____________ Movements: ____________ Start time: ____________ Finish time: ____________ °Date: ____________ Movements: ____________ Start time: ____________ Finish time: ____________ °Date: ____________ Movements: ____________ Start time: ____________ Finish time: ____________ °Date: ____________ Movements: ____________ Start time: ____________ Finish time: ____________ °Date: ____________ Movements: ____________ Start time: ____________ Finish time: ____________ °Date: ____________ Movements: ____________ Start time: ____________ Finish time: ____________  °Date: ____________ Movements: ____________ Start time: ____________ Finish time: ____________ °Date: ____________ Movements: ____________ Start time: ____________ Finish time: ____________ °Date: ____________ Movements: ____________ Start time: ____________ Finish time: ____________ °Date: ____________ Movements: ____________ Start time: ____________ Finish time: ____________ °Date: ____________ Movements: ____________ Start time: ____________ Finish time: ____________ °Date:  ____________ Movements: ____________ Start time: ____________ Finish time: ____________ °Date: ____________ Movements: ____________ Start time: ____________ Finish time: ____________  °Date: ____________ Movements: ____________ Start time: ____________ Finish time: ____________ °Date: ____________ Movements: ____________ Start time: ____________ Finish time: ____________ °Date: ____________ Movements: ____________ Start time: ____________ Finish time: ____________ °Date: ____________ Movements: ____________ Start time: ____________ Finish time: ____________ °Date: ____________ Movements: ____________ Start time: ____________ Finish time: ____________ °Date: ____________ Movements: ____________ Start time: ____________ Finish time: ____________ °Document Released: 08/24/2006 Document Revised: 12/09/2013 Document Reviewed: 05/21/2012 °[employer]® Patient Information ©2015 [employer]. This information is not intended to replace advice given to you by your health care provider. Make sure you discuss any questions you have with your health care provider. ° °

## 2015-02-18 ENCOUNTER — Encounter: Payer: Medicaid Other | Admitting: Advanced Practice Midwife

## 2015-02-25 ENCOUNTER — Ambulatory Visit (INDEPENDENT_AMBULATORY_CARE_PROVIDER_SITE_OTHER): Payer: Medicaid Other | Admitting: Certified Nurse Midwife

## 2015-02-25 VITALS — BP 124/87 | HR 108 | Temp 98.7°F | Wt 218.4 lb

## 2015-02-25 DIAGNOSIS — Z3403 Encounter for supervision of normal first pregnancy, third trimester: Secondary | ICD-10-CM | POA: Diagnosis present

## 2015-02-25 LAB — POCT URINALYSIS DIP (DEVICE)
Bilirubin Urine: NEGATIVE
Glucose, UA: NEGATIVE mg/dL
Hgb urine dipstick: NEGATIVE
Ketones, ur: NEGATIVE mg/dL
Leukocytes, UA: NEGATIVE
Nitrite: NEGATIVE
Protein, ur: NEGATIVE mg/dL
Specific Gravity, Urine: 1.02 (ref 1.005–1.030)
Urobilinogen, UA: 0.2 mg/dL (ref 0.0–1.0)
pH: 6.5 (ref 5.0–8.0)

## 2015-02-25 NOTE — Progress Notes (Signed)
Edema- bilateral legs

## 2015-02-25 NOTE — Progress Notes (Signed)
Subjective:  Lorraine RegisterChristina A Walker is a 19 y.o. G1P0 at 8661w2d being seen today for ongoing prenatal care.  Patient reports no complaints.  Contractions: Irritability.  Vag. Bleeding: None. Movement: Present. Denies leaking of fluid.   The following portions of the patient's history were reviewed and updated as appropriate: allergies, current medications, past family history, past medical history, past social history, past surgical history and problem list.   Objective:   Filed Vitals:   02/25/15 1038 02/25/15 1040  BP: 130/93 124/87  Pulse: 108   Temp: 98.7 F (37.1 C)   Weight: 218 lb 6.4 oz (99.066 kg)     Fetal Status: Fetal Heart Rate (bpm): 146   Movement: Present     General:  Alert, oriented and cooperative. Patient is in no acute distress.  Skin: Skin is warm and dry. No rash noted.   Cardiovascular: Normal heart rate noted  Respiratory: Normal respiratory effort, no problems with respiration noted  Abdomen: Soft, gravid, appropriate for gestational age. Pain/Pressure: Absent     Vaginal: Vag. Bleeding: None.    Vag D/C Character: White  Cervix: Not evaluated        Extremities: Normal range of motion.  Edema: Mild pitting, slight indentation  Mental Status: Normal mood and affect. Normal behavior. Normal judgment and thought content.   Urinalysis:      Assessment and Plan:  Pregnancy: G1P0 at 261w2d  There are no diagnoses linked to this encounter. Term labor symptoms and general obstetric precautions including but not limited to vaginal bleeding, contractions, leaking of fluid and fetal movement were reviewed in detail with the patient. Please refer to After Visit Summary for other counseling recommendations.  Needs to return on 03/02/15 for ob visit and nst  Rhea PinkLori A Clemmons, CNM

## 2015-02-25 NOTE — Patient Instructions (Signed)
Third Trimester of Pregnancy The third trimester is from week 29 through week 42, months 7 through 9. The third trimester is a time when the fetus is growing rapidly. At the end of the ninth month, the fetus is about 20 inches in length and weighs 6-10 pounds.  BODY CHANGES Your body goes through many changes during pregnancy. The changes vary from woman to woman.   Your weight will continue to increase. You can expect to gain 25-35 pounds (11-16 kg) by the end of the pregnancy.  You may begin to get stretch marks on your hips, abdomen, and breasts.  You may urinate more often because the fetus is moving lower into your pelvis and pressing on your bladder.  You may develop or continue to have heartburn as a result of your pregnancy.  You may develop constipation because certain hormones are causing the muscles that push waste through your intestines to slow down.  You may develop hemorrhoids or swollen, bulging veins (varicose veins).  You may have pelvic pain because of the weight gain and pregnancy hormones relaxing your joints between the bones in your pelvis. Backaches may result from overexertion of the muscles supporting your posture.  You may have changes in your hair. These can include thickening of your hair, rapid growth, and changes in texture. Some women also have hair loss during or after pregnancy, or hair that feels dry or thin. Your hair will most likely return to normal after your baby is born.  Your breasts will continue to grow and be tender. A yellow discharge may leak from your breasts called colostrum.  Your belly button may stick out.  You may feel short of breath because of your expanding uterus.  You may notice the fetus "dropping," or moving lower in your abdomen.  You may have a bloody mucus discharge. This usually occurs a few days to a week before labor begins.  Your cervix becomes thin and soft (effaced) near your due date. WHAT TO EXPECT AT YOUR PRENATAL  EXAMS  You will have prenatal exams every 2 weeks until week 36. Then, you will have weekly prenatal exams. During a routine prenatal visit:  You will be weighed to make sure you and the fetus are growing normally.  Your blood pressure is taken.  Your abdomen will be measured to track your baby's growth.  The fetal heartbeat will be listened to.  Any test results from the previous visit will be discussed.  You may have a cervical check near your due date to see if you have effaced. At around 36 weeks, your caregiver will check your cervix. At the same time, your caregiver will also perform a test on the secretions of the vaginal tissue. This test is to determine if a type of bacteria, Group B streptococcus, is present. Your caregiver will explain this further. Your caregiver may ask you:  What your birth plan is.  How you are feeling.  If you are feeling the baby move.  If you have had any abnormal symptoms, such as leaking fluid, bleeding, severe headaches, or abdominal cramping.  If you have any questions. Other tests or screenings that may be performed during your third trimester include:  Blood tests that check for low iron levels (anemia).  Fetal testing to check the health, activity level, and growth of the fetus. Testing is done if you have certain medical conditions or if there are problems during the pregnancy. FALSE LABOR You may feel small, irregular contractions that   eventually go away. These are called Braxton Hicks contractions, or false labor. Contractions may last for hours, days, or even weeks before true labor sets in. If contractions come at regular intervals, intensify, or become painful, it is best to be seen by your caregiver.  SIGNS OF LABOR   Menstrual-like cramps.  Contractions that are 5 minutes apart or less.  Contractions that start on the top of the uterus and spread down to the lower abdomen and back.  A sense of increased pelvic pressure or back  pain.  A watery or bloody mucus discharge that comes from the vagina. If you have any of these signs before the 37th week of pregnancy, call your caregiver right away. You need to go to the hospital to get checked immediately. HOME CARE INSTRUCTIONS   Avoid all smoking, herbs, alcohol, and unprescribed drugs. These chemicals affect the formation and growth of the baby.  Follow your caregiver's instructions regarding medicine use. There are medicines that are either safe or unsafe to take during pregnancy.  Exercise only as directed by your caregiver. Experiencing uterine cramps is a good sign to stop exercising.  Continue to eat regular, healthy meals.  Wear a good support bra for breast tenderness.  Do not use hot tubs, steam rooms, or saunas.  Wear your seat belt at all times when driving.  Avoid raw meat, uncooked cheese, cat litter boxes, and soil used by cats. These carry germs that can cause birth defects in the baby.  Take your prenatal vitamins.  Try taking a stool softener (if your caregiver approves) if you develop constipation. Eat more high-fiber foods, such as fresh vegetables or fruit and whole grains. Drink plenty of fluids to keep your urine clear or pale yellow.  Take warm sitz baths to soothe any pain or discomfort caused by hemorrhoids. Use hemorrhoid cream if your caregiver approves.  If you develop varicose veins, wear support hose. Elevate your feet for 15 minutes, 3-4 times a day. Limit salt in your diet.  Avoid heavy lifting, wear low heal shoes, and practice good posture.  Rest a lot with your legs elevated if you have leg cramps or low back pain.  Visit your dentist if you have not gone during your pregnancy. Use a soft toothbrush to brush your teeth and be gentle when you floss.  A sexual relationship may be continued unless your caregiver directs you otherwise.  Do not travel far distances unless it is absolutely necessary and only with the approval  of your caregiver.  Take prenatal classes to understand, practice, and ask questions about the labor and delivery.  Make a trial run to the hospital.  Pack your hospital bag.  Prepare the baby's nursery.  Continue to go to all your prenatal visits as directed by your caregiver. SEEK MEDICAL CARE IF:  You are unsure if you are in labor or if your water has broken.  You have dizziness.  You have mild pelvic cramps, pelvic pressure, or nagging pain in your abdominal area.  You have persistent nausea, vomiting, or diarrhea.  You have a bad smelling vaginal discharge.  You have pain with urination. SEEK IMMEDIATE MEDICAL CARE IF:   You have a fever.  You are leaking fluid from your vagina.  You have spotting or bleeding from your vagina.  You have severe abdominal cramping or pain.  You have rapid weight loss or gain.  You have shortness of breath with chest pain.  You notice sudden or extreme swelling   of your face, hands, ankles, feet, or legs.  You have not felt your baby move in over an hour.  You have severe headaches that do not go away with medicine.  You have vision changes. Document Released: 07/19/2001 Document Revised: 07/30/2013 Document Reviewed: 09/25/2012 [employer] Patient Information 2015 [employer]. This information is not intended to replace advice given to you by your health care provider. Make sure you discuss any questions you have with your health care provider.  

## 2015-03-02 ENCOUNTER — Ambulatory Visit (INDEPENDENT_AMBULATORY_CARE_PROVIDER_SITE_OTHER): Payer: Medicaid Other | Admitting: *Deleted

## 2015-03-02 VITALS — BP 125/72 | HR 105

## 2015-03-02 DIAGNOSIS — O480 Post-term pregnancy: Secondary | ICD-10-CM

## 2015-03-06 ENCOUNTER — Inpatient Hospital Stay (HOSPITAL_COMMUNITY)
Admission: AD | Admit: 2015-03-06 | Discharge: 2015-03-09 | DRG: 767 | Disposition: A | Discharge status: Home / Self Care | Payer: Medicaid Other | Source: Ambulatory Visit | Attending: Family Medicine | Admitting: Family Medicine

## 2015-03-06 ENCOUNTER — Encounter (HOSPITAL_COMMUNITY): Payer: Self-pay | Admitting: Family Medicine

## 2015-03-06 ENCOUNTER — Ambulatory Visit (INDEPENDENT_AMBULATORY_CARE_PROVIDER_SITE_OTHER): Payer: Medicaid Other | Admitting: Family

## 2015-03-06 VITALS — BP 123/99 | HR 101 | Wt 219.2 lb

## 2015-03-06 DIAGNOSIS — O360121 Maternal care for anti-D [Rh] antibodies, second trimester, fetus 1: Secondary | ICD-10-CM

## 2015-03-06 DIAGNOSIS — O99334 Smoking (tobacco) complicating childbirth: Secondary | ICD-10-CM | POA: Diagnosis present

## 2015-03-06 DIAGNOSIS — Z8249 Family history of ischemic heart disease and other diseases of the circulatory system: Secondary | ICD-10-CM

## 2015-03-06 DIAGNOSIS — O99330 Smoking (tobacco) complicating pregnancy, unspecified trimester: Secondary | ICD-10-CM

## 2015-03-06 DIAGNOSIS — O480 Post-term pregnancy: Secondary | ICD-10-CM

## 2015-03-06 DIAGNOSIS — O4103X0 Oligohydramnios, third trimester, not applicable or unspecified: Principal | ICD-10-CM | POA: Diagnosis present

## 2015-03-06 DIAGNOSIS — O26893 Other specified pregnancy related conditions, third trimester: Secondary | ICD-10-CM | POA: Diagnosis present

## 2015-03-06 DIAGNOSIS — O133 Gestational [pregnancy-induced] hypertension without significant proteinuria, third trimester: Secondary | ICD-10-CM | POA: Diagnosis present

## 2015-03-06 DIAGNOSIS — Z6791 Unspecified blood type, Rh negative: Secondary | ICD-10-CM

## 2015-03-06 DIAGNOSIS — Z3A40 40 weeks gestation of pregnancy: Secondary | ICD-10-CM | POA: Diagnosis present

## 2015-03-06 DIAGNOSIS — M419 Scoliosis, unspecified: Secondary | ICD-10-CM | POA: Diagnosis present

## 2015-03-06 DIAGNOSIS — G8929 Other chronic pain: Secondary | ICD-10-CM | POA: Diagnosis present

## 2015-03-06 DIAGNOSIS — F17210 Nicotine dependence, cigarettes, uncomplicated: Secondary | ICD-10-CM | POA: Diagnosis present

## 2015-03-06 LAB — POCT URINALYSIS DIP (DEVICE)
Bilirubin Urine: NEGATIVE
Glucose, UA: NEGATIVE mg/dL
Hgb urine dipstick: NEGATIVE
Ketones, ur: NEGATIVE mg/dL
Leukocytes, UA: NEGATIVE
NITRITE: NEGATIVE
PH: 6 (ref 5.0–8.0)
Protein, ur: NEGATIVE mg/dL
Specific Gravity, Urine: 1.02 (ref 1.005–1.030)
UROBILINOGEN UA: 0.2 mg/dL (ref 0.0–1.0)

## 2015-03-06 LAB — CBC
HCT: 35.6 % — ABNORMAL LOW (ref 36.0–46.0)
Hemoglobin: 11.8 g/dL — ABNORMAL LOW (ref 12.0–15.0)
MCH: 28.1 pg (ref 26.0–34.0)
MCHC: 33.1 g/dL (ref 30.0–36.0)
MCV: 84.8 fL (ref 78.0–100.0)
Platelets: 304 10*3/uL (ref 150–400)
RBC: 4.2 MIL/uL (ref 3.87–5.11)
RDW: 14.7 % (ref 11.5–15.5)
WBC: 16.8 10*3/uL — AB (ref 4.0–10.5)

## 2015-03-06 MED ORDER — ONDANSETRON HCL 4 MG/2ML IJ SOLN: 4.0000 mg | Freq: Four times a day (QID) | INTRAMUSCULAR | Status: DC | PRN

## 2015-03-06 MED ORDER — OXYCODONE-ACETAMINOPHEN 5-325 MG PO TABS: 2.0000 | ORAL_TABLET | ORAL | Status: DC | PRN

## 2015-03-06 MED ORDER — OXYTOCIN 40 UNITS IN LACTATED RINGERS INFUSION - SIMPLE MED: 62.5000 mL/h | INTRAVENOUS | Status: DC

## 2015-03-06 MED ORDER — OXYTOCIN BOLUS FROM INFUSION
500.0000 mL | INTRAVENOUS | Status: DC
  Administered 2015-03-07: 500 mL via INTRAVENOUS

## 2015-03-06 MED ORDER — NICOTINE 21 MG/24HR TD PT24
21.0000 mg | MEDICATED_PATCH | Freq: Every day | TRANSDERMAL | Status: DC
  Administered 2015-03-07 (×2): 21 mg via TRANSDERMAL
  Filled 2015-03-06 (×3): qty 1

## 2015-03-06 MED ORDER — FLEET ENEMA 7-19 GM/118ML RE ENEM: 1.0000 | ENEMA | RECTAL | Status: DC | PRN

## 2015-03-06 MED ORDER — LACTATED RINGERS IV SOLN
INTRAVENOUS | Status: DC
  Administered 2015-03-06 – 2015-03-07 (×3): via INTRAVENOUS

## 2015-03-06 MED ORDER — OXYCODONE-ACETAMINOPHEN 5-325 MG PO TABS
1.0000 | ORAL_TABLET | ORAL | Status: DC | PRN
  Administered 2015-03-07 (×2): 1 via ORAL
  Filled 2015-03-06 (×3): qty 1

## 2015-03-06 MED ORDER — LIDOCAINE HCL (PF) 1 % IJ SOLN
30.0000 mL | INTRAMUSCULAR | Status: DC | PRN
  Filled 2015-03-06: qty 30

## 2015-03-06 MED ORDER — MISOPROSTOL 200 MCG PO TABS
50.0000 ug | ORAL_TABLET | ORAL | Status: DC
  Administered 2015-03-06 – 2015-03-07 (×4): 50 ug via ORAL
  Filled 2015-03-06 (×5): qty 0.5

## 2015-03-06 MED ORDER — ACETAMINOPHEN 325 MG PO TABS
650.0000 mg | ORAL_TABLET | ORAL | Status: DC | PRN
  Administered 2015-03-07: 650 mg via ORAL
  Filled 2015-03-06: qty 2

## 2015-03-06 MED ORDER — CITRIC ACID-SODIUM CITRATE 334-500 MG/5ML PO SOLN: 30.0000 mL | ORAL | Status: DC | PRN

## 2015-03-06 MED ORDER — LACTATED RINGERS IV SOLN
500.0000 mL | INTRAVENOUS | Status: DC | PRN
  Administered 2015-03-07: 500 mL via INTRAVENOUS

## 2015-03-06 NOTE — Progress Notes (Signed)
Pt declines IOL because she wants to have a waterbirth. She agrees to continue twice weekly fetal testing. Pt desires to have membranes swept by female provider only.

## 2015-03-06 NOTE — Progress Notes (Signed)
Lorraine Walker is a 19 y.o. G1P0 at [redacted]w[redacted]d  admitted for decreased AFI and gestational HTN  Subjective:  Comfortable not feeling contractions; Objective: BP 125/81 mmHg  Pulse 116  Temp(Src) 98.6 F (37 C) (Oral)  Resp 18  Ht 5' (1.524 m)  Wt 99.338 kg (219 lb)  BMI 42.77 kg/m2  LMP 05/26/2014 (Exact Date)      FHT:  FHR: 145 bpm, variability: moderate,  accelerations:  Present,  decelerations:  Absent UC:   none SVE:      Labs: Lab Results  Component Value Date   WBC 16.8* 03/06/2015   HGB 11.8* 03/06/2015   HCT 35.6* 03/06/2015   MCV 84.8 03/06/2015   PLT 304 03/06/2015    Assessment / Plan: cervical ripening  Repeat oral Cytotec @ 1100 pm  Labor:  Preeclampsia:  labs stable Fetal Wellbeing:  Category I Pain Control:  Labor support without medications I/D:  gbs neg Anticipated MOD:  NSVD  Lorraine Walker 03/06/2015, 9:13 PM

## 2015-03-06 NOTE — Progress Notes (Signed)
Subjective:  Lorraine Walker is a 19 y.o. G1P0 at [redacted]w[redacted]d being seen today for ongoing prenatal care.  Patient reports denies headache, vision changes, or epigastric.  Marland Kitchen  Contractions: Irregular.  Vag. Bleeding: None. Movement: Present. Denies leaking of fluid.   The following portions of the patient's history were reviewed and updated as appropriate: allergies, current medications, past family history, past medical history, past social history, past surgical history and problem list.   Objective:   Filed Vitals:   03/06/15 1033  BP: 123/99  Pulse: 101  Weight: 219 lb 3.2 oz (99.428 kg)    Fetal Status:     Movement: Present  Presentation: Vertex  General:  Alert, oriented and cooperative. Patient is in no acute distress.  Skin: Skin is warm and dry. No rash noted.   Cardiovascular: Normal heart rate noted  Respiratory: Normal respiratory effort, no problems with respiration noted  Abdomen: Soft, gravid, appropriate for gestational age. Pain/Pressure: Present     Vaginal: Vag. Bleeding: None.       Cervix: Exam revealed Dilation: Closed      Extremities: Normal range of motion.  Edema: Mild pitting, slight indentation  Mental Status: Normal mood and affect. Normal behavior. Normal judgment and thought content.   Urinalysis: Urine Protein: Negative Urine Glucose: Negative  Consulted with Dr. Macon Large > Reviewed HPI/Exam/labs  > IOL Assessment and Plan:  Pregnancy: G1P0 at [redacted]w[redacted]d  1. Post term pregnancy, antepartum condition or complication - Amniotic fluid index with NST  2.  Gestational Hypertension - IOL  3.  Low Amniotic Fluid - IOL   Marlis Edelson, CNM

## 2015-03-06 NOTE — H&P (Signed)
Lorraine Walker is a 19 y.o. female presenting for IOL 2/2 elevated diastolic pressures in clinic this morning as well as decreased AFI. Pt states she is currently asymptomatic. She is not feeling any contractions. She is upset because she was expecting to have a water birth and had taken the required class, paid, and had things arranged, but can no longer safely have intermittent monitoring. She is worried about her chronic back and leg pain (scoliosis and hardware respectively). She is reluctant, but amenable to IOL. She states her pregnancy has been w/o complication and that she is not on any meds other than occasional albuterol use for asthma. She is a smoker, but does not need nicotine replacement. Maternal Medical History:  Reason for admission: Nausea.    OB History    Gravida Para Term Preterm AB TAB SAB Ectopic Multiple Living   1              Past Medical History  Diagnosis Date  . Asthma   . Scoliosis    Past Surgical History  Procedure Laterality Date  . Fracture surgery    . Cosmetic surgery     Family History: family history includes Hypertension in her father. Social History:  reports that she has been smoking Cigarettes.  She has been smoking about 0.25 packs per day. She has never used smokeless tobacco. She reports that she does not drink alcohol or use illicit drugs.   Clinic Low Risk Prenatal Labs  Dating 7 week [place] Blood type: O/NEG/-- (03/24 1604) O Neg  Genetic Screen  Quad:  Negative                                          NIPS: Antibody:NEG (03/24 1604)  Anatomic [place]  Normal, size = dates, they recomm. followup 6 wks growth due to "late date of first [place]" Rubella: 2.65 (03/24 1604) Immune  GTT Early:               Third trimester: 97 RPR: NON REAC (03/24 1604)   Flu vaccine  Declined HBsAg: NEGATIVE (03/24 1604)   TDaP vaccine          01/15/2015                                     Rhogam: 01/15/2015 HIV: NONREACTIVE (03/24 1604)   GBS   Neg                          GBS: Neg  Contraception  None Pap: negative 10/30/14  Baby Food  Breast   Circumcision  Female   Pediatrician  Given list 01/29/15. Lives in [place]   Support Person  FOB     Prenatal Transfer Tool  Maternal Diabetes: No Genetic Screening: Normal Maternal Ultrasounds/Referrals: Normal Fetal Ultrasounds or other Referrals:  None Maternal Substance Abuse:  Yes:  Type: Smoker Significant Maternal Medications:  None Significant Maternal Lab Results:  Lab values include: Rh negative Other Comments:  None  Review of Systems  Constitutional: Negative for fever and chills.  Respiratory: Negative for shortness of breath.   Cardiovascular: Negative for chest pain and leg swelling.  Gastrointestinal: Negative for heartburn, nausea, vomiting, abdominal pain, diarrhea and constipation.  Genitourinary: Negative for urgency and frequency.  Skin: Positive for itching. Negative for rash.  Neurological: Negative for weakness and headaches.      Last menstrual period 05/26/2014. Maternal Exam:  Uterine Assessment: Contraction strength is mild.  Contraction frequency is irregular.   Abdomen: Patient reports no abdominal tenderness.   Fetal Exam Fetal Monitor Review: Variability: moderate (6-25 bpm).   Pattern: no accelerations and variable decelerations.       Physical Exam  Constitutional: She is oriented to person, place, and time. She appears well-developed and well-nourished.  HENT:  Head: Normocephalic and atraumatic.  Eyes: Conjunctivae and EOM are normal.  Neck: Normal range of motion. No tracheal deviation present.  Cardiovascular: Normal rate and regular rhythm.  Exam reveals no gallop and no friction rub.   No murmur heard. Respiratory: Effort normal. No respiratory distress. She has wheezes. She has no rales. She exhibits no tenderness.  GI: Soft. She exhibits no distension and no mass. There is no tenderness. There is no rebound and no guarding.  Genitourinary: Uterus  normal.  Musculoskeletal: Normal range of motion.  Neurological: She is alert and oriented to person, place, and time.  Skin: Skin is warm and dry.  Psychiatric: She has a normal mood and affect. Her behavior is normal. Judgment and thought content normal.    Prenatal labs: ABO, Rh: O/NEG/-- (03/24 1604) Antibody: NEG (03/24 1604) Rubella: 2.65 (03/24 1604) RPR: NON REAC (06/24 1109)  HBsAg: NEGATIVE (03/24 1604)  HIV: NONREACTIVE (06/24 1109)  GBS:     Assessment/Plan: Pt is a 19 yo f G1P0 at [redacted]w[redacted]d who presents to L&D from clinic for IOL 2/2 decreased AFI and elevated diastolic BP. She is otherwise w/o complaint. Given the need for ongoing monitoring, she is no longer a candidate for waterbirth as she had planned.  IOL #standard labor admission orders in place #GBS unknown; PCR ordered #cytotec ordered for induction; will proceed to foley as needed #q2hr checks #pt is Rh neg   Lorraine Walker 03/06/2015, 12:37 PM    OB fellow attestation: I have seen and examined this patient; I agree with above documentation in the resident's note.  Lorraine Walker is a 19 y.o. G1P0 here for IOL 2/2 to oligo and elevated DBP PE: BP 123/77 mmHg  Pulse 96  Temp(Src) 99.6 F (37.6 C) (Oral)  Resp 18  Ht 5' (1.524 m)  Wt 219 lb (99.338 kg)  BMI 42.77 kg/m2  LMP 05/26/2014 (Exact Date) Gen: calm comfortable, NAD Resp: normal effort, no distress Abd: gravid  ROS, labs, PMH reviewed  Plan: MOF: Breast MOC: None ID: GBS NEG FWB: Cat I Labor: Induction with cytotec, when favorable, foley bulb Circ: as outpatient  Indication for IOL: oligo and elevated DBP  We discussed reason for induction today. Reviewed risks, benefits and alternatives.  Discussed in detail the methods for cervical ripening including but not limited to cytotec and foley bulb. We discussed use of pitocin to help intensify contractions. Reviewed possibility of AROM if necessary.  Patient asked appropriate questions  and they were answered. Patient agreed to induction.  Federico Flake, MD  Family Medicine, OB Fellow 03/06/2015, 6:23 PM

## 2015-03-07 ENCOUNTER — Inpatient Hospital Stay (HOSPITAL_COMMUNITY): Payer: Medicaid Other | Admitting: Anesthesiology

## 2015-03-07 ENCOUNTER — Encounter (HOSPITAL_COMMUNITY): Payer: Self-pay | Admitting: *Deleted

## 2015-03-07 LAB — RPR: RPR: NONREACTIVE

## 2015-03-07 MED ORDER — FLEET ENEMA 7-19 GM/118ML RE ENEM: 1.0000 | ENEMA | Freq: Every day | RECTAL | Status: DC | PRN

## 2015-03-07 MED ORDER — ONDANSETRON HCL 4 MG/2ML IJ SOLN: 4.0000 mg | INTRAMUSCULAR | Status: DC | PRN

## 2015-03-07 MED ORDER — TERBUTALINE SULFATE 1 MG/ML IJ SOLN
0.2500 mg | Freq: Once | INTRAMUSCULAR | Status: DC | PRN
Start: 2015-03-07 — End: 2015-03-07
  Filled 2015-03-07: qty 1

## 2015-03-07 MED ORDER — TETANUS-DIPHTH-ACELL PERTUSSIS 5-2.5-18.5 LF-MCG/0.5 IM SUSP: 0.5000 mL | Freq: Once | INTRAMUSCULAR | Status: DC

## 2015-03-07 MED ORDER — LANOLIN HYDROUS EX OINT: TOPICAL_OINTMENT | CUTANEOUS | Status: DC | PRN

## 2015-03-07 MED ORDER — FENTANYL 2.5 MCG/ML BUPIVACAINE 1/10 % EPIDURAL INFUSION (WH - ANES)
14.0000 mL/h | INTRAMUSCULAR | Status: DC | PRN
  Administered 2015-03-07 (×2): 14 mL/h via EPIDURAL
  Filled 2015-03-07: qty 125

## 2015-03-07 MED ORDER — SIMETHICONE 80 MG PO CHEW: 80.0000 mg | CHEWABLE_TABLET | ORAL | Status: DC | PRN

## 2015-03-07 MED ORDER — BISACODYL 10 MG RE SUPP: 10.0000 mg | Freq: Every day | RECTAL | Status: DC | PRN

## 2015-03-07 MED ORDER — DIBUCAINE 1 % RE OINT: 1.0000 "application " | TOPICAL_OINTMENT | RECTAL | Status: DC | PRN

## 2015-03-07 MED ORDER — ALBUTEROL SULFATE (5 MG/ML) 0.5% IN NEBU: 2.5000 mg | INHALATION_SOLUTION | Freq: Four times a day (QID) | RESPIRATORY_TRACT | Status: DC | PRN

## 2015-03-07 MED ORDER — ZOLPIDEM TARTRATE 5 MG PO TABS
5.0000 mg | ORAL_TABLET | Freq: Every evening | ORAL | Status: DC | PRN
  Administered 2015-03-07: 5 mg via ORAL
  Filled 2015-03-07: qty 1

## 2015-03-07 MED ORDER — SODIUM CHLORIDE 0.9 % IV SOLN: 250.0000 mL | INTRAVENOUS | Status: DC | PRN

## 2015-03-07 MED ORDER — BENZOCAINE-MENTHOL 20-0.5 % EX AERO: 1.0000 "application " | INHALATION_SPRAY | CUTANEOUS | Status: DC | PRN

## 2015-03-07 MED ORDER — SODIUM CHLORIDE 0.9 % IJ SOLN: 3.0000 mL | INTRAMUSCULAR | Status: DC | PRN

## 2015-03-07 MED ORDER — OXYTOCIN 40 UNITS IN LACTATED RINGERS INFUSION - SIMPLE MED: 62.5000 mL/h | INTRAVENOUS | Status: DC | PRN

## 2015-03-07 MED ORDER — DIPHENHYDRAMINE HCL 50 MG/ML IJ SOLN: 12.5000 mg | INTRAMUSCULAR | Status: DC | PRN

## 2015-03-07 MED ORDER — SODIUM CHLORIDE 0.9 % IJ SOLN: 3.0000 mL | Freq: Two times a day (BID) | INTRAMUSCULAR | Status: DC

## 2015-03-07 MED ORDER — IPRATROPIUM-ALBUTEROL 0.5-2.5 (3) MG/3ML IN SOLN
3.0000 mL | Freq: Four times a day (QID) | RESPIRATORY_TRACT | Status: DC | PRN
  Filled 2015-03-07: qty 3

## 2015-03-07 MED ORDER — PRENATAL MULTIVITAMIN CH
1.0000 | ORAL_TABLET | Freq: Every day | ORAL | Status: DC
  Administered 2015-03-09: 1 via ORAL
  Filled 2015-03-07 (×2): qty 1

## 2015-03-07 MED ORDER — EPHEDRINE 5 MG/ML INJ
10.0000 mg | INTRAVENOUS | Status: DC | PRN
  Filled 2015-03-07: qty 2

## 2015-03-07 MED ORDER — SENNOSIDES-DOCUSATE SODIUM 8.6-50 MG PO TABS
2.0000 | ORAL_TABLET | ORAL | Status: DC
  Administered 2015-03-07 – 2015-03-08 (×2): 2 via ORAL
  Filled 2015-03-07 (×2): qty 2

## 2015-03-07 MED ORDER — LACTATED RINGERS IV SOLN
INTRAVENOUS | Status: DC
  Administered 2015-03-07: 12:00:00 via INTRAUTERINE

## 2015-03-07 MED ORDER — OXYTOCIN 40 UNITS IN LACTATED RINGERS INFUSION - SIMPLE MED
1.0000 m[IU]/min | INTRAVENOUS | Status: DC
  Administered 2015-03-07: 2 m[IU]/min via INTRAVENOUS
  Filled 2015-03-07: qty 1000

## 2015-03-07 MED ORDER — PHENYLEPHRINE 40 MCG/ML (10ML) SYRINGE FOR IV PUSH (FOR BLOOD PRESSURE SUPPORT)
80.0000 ug | PREFILLED_SYRINGE | INTRAVENOUS | Status: DC | PRN
  Filled 2015-03-07: qty 20
  Filled 2015-03-07: qty 2

## 2015-03-07 MED ORDER — ONDANSETRON HCL 4 MG PO TABS: 4.0000 mg | ORAL_TABLET | ORAL | Status: DC | PRN

## 2015-03-07 MED ORDER — LIDOCAINE HCL (PF) 1 % IJ SOLN
INTRAMUSCULAR | Status: DC | PRN
  Administered 2015-03-07: 5 mL
  Administered 2015-03-07: 3 mL
  Administered 2015-03-07: 5 mL

## 2015-03-07 MED ORDER — ALBUTEROL SULFATE (2.5 MG/3ML) 0.083% IN NEBU
2.5000 mg | INHALATION_SOLUTION | RESPIRATORY_TRACT | Status: DC | PRN
  Administered 2015-03-08 (×2): 2.5 mg via RESPIRATORY_TRACT
  Filled 2015-03-07 (×2): qty 3

## 2015-03-07 MED ORDER — FENTANYL CITRATE (PF) 100 MCG/2ML IJ SOLN
100.0000 ug | INTRAMUSCULAR | Status: DC | PRN
  Administered 2015-03-07: 100 ug via INTRAVENOUS
  Filled 2015-03-07: qty 2

## 2015-03-07 MED ORDER — IPRATROPIUM-ALBUTEROL 0.5-2.5 (3) MG/3ML IN SOLN
3.0000 mL | Freq: Once | RESPIRATORY_TRACT | Status: AC
  Administered 2015-03-07: 3 mL via RESPIRATORY_TRACT
  Filled 2015-03-07: qty 3

## 2015-03-07 MED ORDER — WITCH HAZEL-GLYCERIN EX PADS: 1.0000 "application " | MEDICATED_PAD | CUTANEOUS | Status: DC | PRN

## 2015-03-07 MED ORDER — IBUPROFEN 600 MG PO TABS
600.0000 mg | ORAL_TABLET | Freq: Four times a day (QID) | ORAL | Status: DC
  Administered 2015-03-07 – 2015-03-09 (×8): 600 mg via ORAL
  Filled 2015-03-07 (×8): qty 1

## 2015-03-07 MED ORDER — ZOLPIDEM TARTRATE 5 MG PO TABS: 5.0000 mg | ORAL_TABLET | Freq: Every evening | ORAL | Status: DC | PRN

## 2015-03-07 MED ORDER — MEASLES, MUMPS & RUBELLA VAC ~~LOC~~ INJ
0.5000 mL | INJECTION | Freq: Once | SUBCUTANEOUS | Status: DC
  Filled 2015-03-07: qty 0.5

## 2015-03-07 MED ORDER — OXYCODONE-ACETAMINOPHEN 5-325 MG PO TABS
1.0000 | ORAL_TABLET | ORAL | Status: DC | PRN
  Administered 2015-03-08 – 2015-03-09 (×3): 1 via ORAL
  Filled 2015-03-07: qty 1

## 2015-03-07 MED ORDER — ACETAMINOPHEN 325 MG PO TABS: 650.0000 mg | ORAL_TABLET | ORAL | Status: DC | PRN

## 2015-03-07 MED ORDER — OXYCODONE-ACETAMINOPHEN 5-325 MG PO TABS
2.0000 | ORAL_TABLET | ORAL | Status: DC | PRN
Start: 2015-03-07 — End: 2015-03-09
  Filled 2015-03-07 (×2): qty 2

## 2015-03-07 MED ORDER — DIPHENHYDRAMINE HCL 25 MG PO CAPS: 25.0000 mg | ORAL_CAPSULE | Freq: Four times a day (QID) | ORAL | Status: DC | PRN

## 2015-03-07 NOTE — Progress Notes (Signed)
Patient ID: KEITH CANCIO, female   DOB: 11-19-1995, 19 y.o.   MRN: [id number] Lorraine Walker is a 19 y.o. G1P0 at [redacted]w[redacted]d admitted for induction of labor due to elevated bp in clinic and low AFI found as part of modified bpp d/t postdate pregnancy.  Subjective: Comfortable now w/ epidural, no complaints  Objective: BP 96/78 mmHg  Pulse 87  Temp(Src) 97.7 F (36.5 C) (Oral)  Resp 18  Ht 5' (1.524 m)  Wt 99.338 kg (219 lb)  BMI 42.77 kg/m2  SpO2 99%  LMP 05/26/2014 (Exact Date)    FHT:  FHR: 125 bpm, variability: minimal ,  accelerations:  10x10,  decelerations:  Present variables had resolved until turned her supine for SVE UC:   4-7  SVE:  4/90/-1, vtx, bag of water palpated   Labs: Lab Results  Component Value Date   WBC 16.8* 03/06/2015   HGB 11.8* 03/06/2015   HCT 35.6* 03/06/2015   MCV 84.8 03/06/2015   PLT 304 03/06/2015    Assessment / Plan: IOL d/t elevated bp & low AFI in clinic, s/p cytotec x 4, was very anxious and uncomfortable prior to epidural, had planned for foley bulb, now that is 4cm will start pitocin per protocol. Variability is likely decreased d/t having percocet at 0700 and ambien @ 0600.  Dr. Shawnie Pons has reviewed strip, aware of poc  Labor: s/p cervical ripening Fetal Wellbeing:  Category II Pain Control:  Epidural Pre-eclampsia: all bp's normal here since admission, except 2 right at epidural I/D:  n/a Anticipated MOD:  NSVD  [name] CNM, WHNP-BC 03/07/2015, 10:56 AM

## 2015-03-07 NOTE — Progress Notes (Signed)
Patient ID: LUX SKILTON, female   DOB: 08-27-1995, 19 y.o.   MRN: [id number] Lorraine Walker is a 19 y.o. G1P0 at [redacted]w[redacted]d admitted for induction of labor due to elevated bp & low afi in clinic.  Called by RN to evaluate FHR  Subjective: Comfortable w/ epidural  Objective: BP 125/91 mmHg  Pulse 85  Temp(Src) 97.7 F (36.5 C) (Oral)  Resp 18  Ht 5' (1.524 m)  Wt 99.338 kg (219 lb)  BMI 42.77 kg/m2  SpO2 99%  LMP 05/26/2014 (Exact Date)    FHT:  FHR: 125 bpm, variability: moderate,  accelerations:  Present,  decelerations:  Present moderate variables UC:   2-85min  SVE:   Dilation: 4 Effacement (%): 90 Station: -1 Exam by:: Genella Rife, CNM AROM small amt clear fluid IUPC placed w/o difficulty  Pitocin @ 2 mu/min  Labs: Lab Results  Component Value Date   WBC 16.8* 03/06/2015   HGB 11.8* 03/06/2015   HCT 35.6* 03/06/2015   MCV 84.8 03/06/2015   PLT 304 03/06/2015    Assessment / Plan: IOL d/t elevated bp & low afi in clinic. S/P cytotec, just recently started pitocin. Now AROM'd w/ IUPC, amnioinfusion bolus w/ 171ml/hr thereafter for variables, then continue increasing pitocin per protocol to achieve adequate mvu's  Labor: early Fetal Wellbeing:  Category II Pain Control:  Epidural Pre-eclampsia: bp's stable, no pre-e I/D:  n/a Anticipated MOD:  NSVD  [name] CNM, WHNP-BC 03/07/2015, 12:06 PM

## 2015-03-07 NOTE — Progress Notes (Signed)
Labor Progress Note  S: Pt complaining of intractible pain which has not been sufficiently reduced w/ po pain meds through the night; she states she is unable to stay in bed and needs to move; she is conflicted about IV vs epidural; originally wanted IV pain meds, has now reconsidered and wants epidural  O:  BP 133/75 mmHg  Pulse 96  Temp(Src) 97.6 F (36.4 C) (Oral)  Resp 22  Ht 5' (1.524 m)  Wt 99.338 kg (219 lb)  BMI 42.77 kg/m2  SpO2 100%  LMP 05/26/2014 (Exact Date) FHR 130s; mod var; +acels, + early decels CVE: Dilation: 1 Effacement (%): 80 Presentation: Vertex Exam by:: L. Clemmons CNM   A&P: 19 y.o. G1P0 [redacted]w[redacted]d pt is progressing w/ IOL slowly; given previous hx of chronic pain and current pain being experienced by pt, will have epidural placed #will return to place FB #expecting normal VD #q4 hr checks  [name], MD 9:44 AM

## 2015-03-07 NOTE — Lactation Note (Signed)
This note was copied from the chart of Girl Nohemy Cirigliano. Lactation Consultation Note  Patient Name: Girl Mavery Camille Today's Date: 03/07/2015 Reason for consult: Initial assessment;Infant < 6lbs Mom has baby STS. Baby has not had feeding since birth. Mom reports baby suckled few times in L & D and around 1700 but she felt did not latch well. Attempted to latch baby at this visit but she was sleepy. Demonstrated hand expression to Mom and was able to obtain approx 1.5 ml of colostrum. Spoon/finger fed this to the baby then she became more interested in BF. Assisted Mom with positioning and obtaining depth w/latch. Encouraged Mom to BF with feeding ques. Basic teaching reviewed. Once baby became awake she demonstrated some good suckling bursts with swallows noted. Mom has lots of colostrum with hand expression. Lactation brochure left for review, advised of OP services and support group. Encouraged to call for assist with latch.   Maternal Data Has patient been taught Hand Expression?: Yes Does the patient have breastfeeding experience prior to this delivery?: No  Feeding Feeding Type: Breast Fed Length of feed: 0 min  LATCH Score/Interventions Latch: Repeated attempts needed to sustain latch, nipple held in mouth throughout feeding, stimulation needed to elicit sucking reflex. Intervention(s): Adjust position;Assist with latch;Breast massage;Breast compression  Audible Swallowing: A few with stimulation  Type of Nipple: Everted at rest and after stimulation  Comfort (Breast/Nipple): Soft / non-tender     Hold (Positioning): Assistance needed to correctly position infant at breast and maintain latch. Intervention(s): Breastfeeding basics reviewed;Support Pillows;Position options;Skin to skin  LATCH Score: 7  Lactation Tools Discussed/Used WIC Program: No   Consult Status Date: 03/08/15 Follow-up type: In-patient    Alfred Levins 03/07/2015, 10:19 PM

## 2015-03-07 NOTE — Progress Notes (Signed)
Kaylean A Woodmansee is a 19 y.o. G1P0 at [redacted]w[redacted]d admitted for decreased AFI/HTN  Subjective: Sleeping after taking Ambien  Objective: BP 117/71 mmHg  Pulse 112  Temp(Src) 97.8 F (36.6 C) (Oral)  Resp 18  Ht 5' (1.524 m)  Wt 99.338 kg (219 lb)  BMI 42.77 kg/m2  LMP 05/26/2014 (Exact Date)      FHT:  Cat 1 UC:   Unable to trace due to maternal position SVE:   Dilation:  (pt refused SVE, ok to give cytotec per CNM) Dilation: 1 Effacement (%): 80 Presentation: Vertex Exam by:: L. Clemmons CNM  Labs: Lab Results  Component Value Date   WBC 16.8* 03/06/2015   HGB 11.8* 03/06/2015   HCT 35.6* 03/06/2015   MCV 84.8 03/06/2015   PLT 304 03/06/2015    Assessment / Plan: ervical Ripening : Cytotec dose #5 due @ 730am  Labor: Progressing normally Fetal Wellbeing:  Category I Pain Control:  Labor support without medications Anticipated MOD:  NSVD  Clemmons,Lori Grissett 03/07/2015, 6:41 AM

## 2015-03-07 NOTE — Anesthesia Preprocedure Evaluation (Signed)
Anesthesia Evaluation  Patient identified by MRN, date of birth, ID band Patient awake    Reviewed: Allergy & Precautions, H&P , NPO status , Patient's Chart, lab work & pertinent test results  History of Anesthesia Complications Negative for: history of anesthetic complications  Airway Mallampati: II  TM Distance: >3 FB Neck ROM: full    Dental no notable dental hx. (+) Teeth Intact   Pulmonary neg pulmonary ROS, asthma , Current Smoker,  breath sounds clear to auscultation  Pulmonary exam normal       Cardiovascular negative cardio ROS Normal cardiovascular examRhythm:regular Rate:Normal     Neuro/Psych negative neurological ROS  negative psych ROS   GI/Hepatic negative GI ROS, Neg liver ROS,   Endo/Other  negative endocrine ROS  Renal/GU negative Renal ROS  negative genitourinary   Musculoskeletal Scoliosis   Abdominal   Peds  Hematology negative hematology ROS (+)   Anesthesia Other Findings   Reproductive/Obstetrics (+) Pregnancy                             Anesthesia Physical Anesthesia Plan  ASA: II  Anesthesia Plan: Epidural   Post-op Pain Management:    Induction:   Airway Management Planned:   Additional Equipment:   Intra-op Plan:   Post-operative Plan:   Informed Consent: I have reviewed the patients History and Physical, chart, labs and discussed the procedure including the risks, benefits and alternatives for the proposed anesthesia with the patient or authorized representative who has indicated his/her understanding and acceptance.     Plan Discussed with:   Anesthesia Plan Comments:         Anesthesia Quick Evaluation

## 2015-03-07 NOTE — Anesthesia Procedure Notes (Signed)
Epidural Patient location during procedure: OB  Staffing Anesthesiologist: Jarrah Seher Performed by: anesthesiologist   Preanesthetic Checklist Completed: patient identified, site marked, surgical consent, pre-op evaluation, timeout performed, IV checked, risks and benefits discussed and monitors and equipment checked  Epidural Patient position: sitting Prep: DuraPrep Patient monitoring: heart rate, continuous pulse ox and blood pressure Approach: right paramedian Location: L3-L4 Injection technique: LOR saline  Needle:  Needle type: Tuohy  Needle gauge: 17 G Needle length: 9 cm and 9 Needle insertion depth: 7 cm Catheter type: closed end flexible Catheter size: 20 Guage Catheter at skin depth: 11 cm Test dose: negative  Assessment Events: blood not aspirated, injection not painful, no injection resistance, negative IV test and no paresthesia  Additional Notes Patient identified. Risks/Benefits/Options discussed with patient including but not limited to bleeding, infection, nerve damage, paralysis, failed block, incomplete pain control, headache, blood pressure changes, nausea, vomiting, reactions to medication both or allergic, itching and postpartum back pain. Confirmed with bedside nurse the patient's most recent platelet count. Confirmed with patient that they are not currently taking any anticoagulation, have any bleeding history or any family history of bleeding disorders. Patient expressed understanding and wished to proceed. All questions were answered. Sterile technique was used throughout the entire procedure. Please see nursing notes for vital signs. Test dose was given through epidural needle and negative prior to continuing to dose epidural or start infusion. Warning signs of high block given to the patient including shortness of breath, tingling/numbness in hands, complete motor block, or any concerning symptoms with instructions to call for help. Patient was given  instructions on fall risk and not to get out of bed. All questions and concerns addressed with instructions to call with any issues.   

## 2015-03-08 MED ORDER — NICOTINE 21 MG/24HR TD PT24
21.0000 mg | MEDICATED_PATCH | Freq: Every day | TRANSDERMAL | Status: DC
  Administered 2015-03-08 – 2015-03-09 (×2): 21 mg via TRANSDERMAL
  Filled 2015-03-08 (×2): qty 1

## 2015-03-08 NOTE — Anesthesia Postprocedure Evaluation (Signed)
  Anesthesia Post-op Note  Patient: Lorraine Walker  Procedure(s) Performed: * No procedures listed *  Patient Location: PACU and Mother/Baby  Anesthesia Type:Epidural  Level of Consciousness: awake, alert  and oriented  Airway and Oxygen Therapy: Patient Spontanous Breathing  Post-op Pain: none  Post-op Assessment: Post-op Vital signs reviewed, Patient's Cardiovascular Status Stable, No headache, No backache, No residual numbness and No residual motor weakness  Post-op Vital Signs: Reviewed and stable  Complications: No apparent anesthesia complications

## 2015-03-08 NOTE — Lactation Note (Signed)
This note was copied from the chart of Lorraine Walker. Lactation Consultation Note  Patient Name: Lorraine Walker Today's Date: 03/08/2015 Reason for consult: Follow-up assessment;Infant < 6lbs Mom had baby latched when LC arrived. Baby sleepy at the breast but does have few good suckling bursts, some swallowing motions noted. Assisted Mom with positioning and re-latched to obtain more depth. Demonstrated ways to stimulate baby while nursing. Mom had pumped with hand pump approx 1 ml of colostrum, spoon fed this back to the baby after the feeding. Set up DEBP for Mom to use to post pump on preemie setting for 15 minutes to encourage milk production and to have some EBM to supplement. Advised Mom baby should be going to the breast every 2-3 hours or more frequent with feeding ques. Advised baby should be nursing for 15-20 minutes with feedings. If baby is not waking to BF and not sustaining the latch she may need to supplement. Advised to give baby any amount of EBM she receives with pumping at present. Encouraged to call for assist as needed. RN aware.   Maternal Data    Feeding Feeding Type: Breast Fed Length of feed: 10 min  LATCH Score/Interventions Latch: Grasps breast easily, tongue down, lips flanged, rhythmical sucking. Intervention(s): Adjust position;Assist with latch;Breast massage;Breast compression  Audible Swallowing: A few with stimulation  Type of Nipple: Everted at rest and after stimulation  Comfort (Breast/Nipple): Soft / non-tender     Hold (Positioning): Assistance needed to correctly position infant at breast and maintain latch. Intervention(s): Breastfeeding basics reviewed;Support Pillows;Position options;Skin to skin  LATCH Score: 8  Lactation Tools Discussed/Used     Consult Status Consult Status: Follow-up Date: 03/09/15 Follow-up type: In-patient    Alfred Levins 03/08/2015, 8:25 PM

## 2015-03-08 NOTE — Progress Notes (Signed)
Post Partum Day 1 Subjective: Eating, drinking, voiding, ambulating well.  +flatus.  Lochia and pain wnl.  Denies dizziness, lightheadedness, or sob. No complaints.  S/P manual placenta extraction  Objective: Blood pressure 118/96, pulse 99, temperature 98.9 F (37.2 C), temperature source Oral, resp. rate 18, height 5' (1.524 m), weight 99.338 kg (219 lb), last menstrual period 05/26/2014, SpO2 99 %, unknown if currently breastfeeding.  Physical Exam:  General: alert, cooperative and no distress Lochia: appropriate Uterine Fundus: firm Incision: n/a DVT Evaluation: No evidence of DVT seen on physical exam. Negative Homan's sign. No cords or calf tenderness. No significant calf/ankle edema.   Recent Labs  03/06/15 1402  HGB 11.8*  HCT 35.6*    Assessment/Plan: Plan for discharge tomorrow and Breastfeeding  Condoms for contraception Plan for d/c tomorrow   LOS: 2 days   Lorraine Walker 03/08/2015, 8:43 AM

## 2015-03-09 ENCOUNTER — Ambulatory Visit: Payer: Self-pay

## 2015-03-09 MED ORDER — OXYCODONE-ACETAMINOPHEN 5-325 MG PO TABS: 1.0000 | ORAL_TABLET | ORAL | Status: DC | PRN

## 2015-03-09 MED ORDER — IBUPROFEN 600 MG PO TABS: 600.0000 mg | ORAL_TABLET | Freq: Four times a day (QID) | ORAL | Status: DC

## 2015-03-09 MED ORDER — OXYCODONE-ACETAMINOPHEN 5-325 MG PO TABS: 2.0000 | ORAL_TABLET | ORAL | Status: DC | PRN

## 2015-03-09 NOTE — Progress Notes (Signed)
UR chart review completed.  

## 2015-03-09 NOTE — Lactation Note (Signed)
This note was copied from the chart of Lorraine Terrisha Paulson. Lactation Consultation Note  Baby < 6 lbs.  Encouraged STS and breastfeeding first before giving supplement. Mother states baby prefers left breast more than right. Suggest she prepump to help latch on right side. Offered to assist mother w/ next feeding.  Encouraged her to call.  Patient Name: Lorraine Walker [id number] Date: 03/09/2015 Reason for consult: Follow-up assessment   Maternal Data    Feeding Feeding Type: Breast Fed Length of feed: 10 min  LATCH Score/Interventions                      Lactation Tools Discussed/Used     Consult Status Consult Status: Follow-up Date: 03/09/15 Follow-up type: In-patient    Dahlia Byes [facility] 03/09/2015, 2:02 PM

## 2015-03-09 NOTE — Lactation Note (Signed)
This note was copied from the chart of Lorraine Walker. Lactation Consultation Note  Mother called for assistance with breastfeeding.  Baby very sleepy.  Suggest mother undress her to diaper. Mother hand expressed drops of colostrum onto spoon and fed to baby but baby did not wake to feed. Last feeding was at 1350 with 10 minutes of breastfeeding and 40 ml of formula.   Reviewed volume guidelines with mother. Suggest she prepump and hand express on left side prior to latching.  Helped position baby in cross cradle but baby is too sleepy. Placed baby STS on mother's chest and encouraged her to call if she needs assistance further w/ latching.   Patient Name: Lorraine Walker [id number] Date: 03/09/2015 Reason for consult: Follow-up assessment   Maternal Data    Feeding Feeding Type: Breast Fed Length of feed: 10 min  LATCH Score/Interventions                      Lactation Tools Discussed/Used     Consult Status Consult Status: Follow-up Date: 03/10/15 Follow-up type: In-patient    Dahlia Byes [facility] 03/09/2015, 3:11 PM

## 2015-03-09 NOTE — Discharge Summary (Signed)
Obstetric Discharge Summary Reason for Admission: onset of labor Prenatal Procedures: ultrasound Intrapartum Procedures: spontaneous vaginal delivery Postpartum Procedures: manual removal of placenta Complications-Operative and Postpartum: none HEMOGLOBIN  Date Value Ref Range Status  03/06/2015 11.8* 12.0 - 15.0 g/dL Final   HCT  Date Value Ref Range Status  03/06/2015 35.6* 36.0 - 46.0 % Final    Physical Exam:  General: alert, cooperative, appears stated age and no distress Lochia: appropriate Uterine Fundus: firm Incision: n/a DVT Evaluation: No evidence of DVT seen on physical exam. Negative Homan's sign. No cords or calf tenderness.  Discharge Diagnoses: Term Pregnancy-delivered  Discharge Information: Date: 03/09/2015 Activity: pelvic rest Diet: routine Medications: PNV, Ibuprofen and Percocet Condition: stable and improved Instructions: refer to practice specific booklet Discharge to: home   Newborn Data: Live born female  Birth Weight: 5 lb 11.2 oz (2586 g) APGAR: 9, 9  Home with mother.  Lorraine Walker Lorraine Walker 03/09/2015, 7:16 AM

## 2015-03-09 NOTE — Lactation Note (Signed)
This note was copied from the chart of Lorraine Araiyah Andrepont. Lactation Consultation Note Follow up visit at 52 hours of age.  Mom reports baby is showing feeding cues and request assist with latch.  Baby is 5#4oz with 7% wt loss. 4 voids in past 24 hours mom reports as small with uric acid noted.  Baby has not had stool in > 36 hours.  Baby has had a few supplementations with formula in bottle with last feeding of .  Discussed feeding supplementation guidelines with mom and wrote on her board to offer 18-58mls per feeding for 8 feedings in 24 hours.  Mom to continue to pump each time baby gets supplementation.  Baby is not showing any transfer of milk at the breast.  Baby is sleepy with poor tone at this time and opens mouth for minimal rooting attempt. Assisted with positioning on both breasts baby latched and took a few sucks and just holds breast in mouth.  Baby does not have an organized suck when checked with gloved finger.  Explained importance of giving baby calories and limiting feedings to .  LC bottle fed with frequent burping and demonstrated paced feedings.  Mom to finish feeding and call RN for assist if baby does not take at least with each feeding.  Mom to pump and offer EBM first.  Discussed option of op appointment if baby is not latching well at discharge.  Mom to call for assist as needed.   Patient Name: Lorraine Walker ZOXWR'U Date: 03/09/2015 Reason for consult: Follow-up assessment;Difficult latch;Infant weight loss;Infant < 6lbs   Maternal Data    Feeding Feeding Type: Breast Fed Length of feed:  (few sucks sleepy)  LATCH Score/Interventions Latch: Repeated attempts needed to sustain latch, nipple held in mouth throughout feeding, stimulation needed to elicit sucking reflex. Intervention(s): Waking techniques;Teach feeding cues;Skin to skin Intervention(s): Adjust position;Assist with latch;Breast massage;Breast compression  Audible Swallowing:  None Intervention(s): Skin to skin;Hand expression;Alternate breast massage  Type of Nipple: Everted at rest and after stimulation  Comfort (Breast/Nipple): Soft / non-tender     Hold (Positioning): Assistance needed to correctly position infant at breast and maintain latch. Intervention(s): Breastfeeding basics reviewed;Skin to skin  LATCH Score: 6  Lactation Tools Discussed/Used Date initiated:: 03/09/15   Consult Status Consult Status: Follow-up Date: 03/10/15 Follow-up type: In-patient    Shoptaw, Arvella Merles 03/09/2015, 5:40 PM

## 2015-03-10 ENCOUNTER — Other Ambulatory Visit: Payer: Medicaid Other

## 2015-03-10 ENCOUNTER — Ambulatory Visit: Payer: Self-pay

## 2015-03-10 LAB — TYPE AND SCREEN
ABO/RH(D): O NEG
ANTIBODY SCREEN: POSITIVE
DAT, IGG: NEGATIVE
UNIT DIVISION: 0
UNIT DIVISION: 0
Unit division: 0
Unit division: 0

## 2015-03-10 NOTE — Lactation Note (Signed)
This note was copied from the chart of Lorraine Minda Dorian. Lactation Consultation Note  Baby 72 hours old. 1 void and 1 stool in the last 24 hours. Baby not waking for feedings. Assisted mother to feed baby.  Reminded mother to undress baby for feedings and wake baby if she does not wake to feed every 3 hours. Mother attempted latching but baby did not open mouth to latch. Baby very sleepy.  Demonstrated to mother how to get baby to take formula bottle supplement by arousing her and pace feeding. Baby took 20 ml of 22 cal formula with slow flow nipple in 30 min with lots of stimulation. Suggest mother continue to pump each time baby gets supplement with formula. Mother states she prefers hand pump to DEBP.  Encouraged mother to call [facility] and set up appointment. Reviewed the importance of calories.  Discussed waking techniques.   Suggest mother set alarm for next feeding at 5pm and future feeding as reminders. LC concerned about feedings and sleepiness.  Discussed with [name] MD.     Patient Name: Lorraine Walker Today's Date: 03/10/2015 Reason for consult: Follow-up assessment   Maternal Data    Feeding    LATCH Score/Interventions                      Lactation Tools Discussed/Used     Consult Status Consult Status: Follow-up Date: 03/10/15 Follow-up type: In-patient    Dahlia Byes [facility] 03/10/2015, 3:01 PM

## 2015-03-10 NOTE — Lactation Note (Signed)
This note was copied from the chart of Lorraine Makhia Fortune. Lactation Consultation Note  Mother resting.  States baby recently breastfed for (she thinks) 15 min. Left LC phone number and suggest she call to view next feeding.  Patient Name: Lorraine Walker 03/10/2015 Reason for consult: Follow-up assessment   Maternal Data    Feeding Feeding Type: Breast Fed Length of feed: 15 min  LATCH Score/Interventions                      Lactation Tools Discussed/Used     Consult Status Consult Status: Follow-up Date: 03/10/15 Follow-up type: In-patient    Dahlia Byes [facility] 03/10/2015, 8:47 AM

## 2015-03-10 NOTE — Lactation Note (Addendum)
This note was copied from the chart of Lorraine Takoya Jonas. Lactation Consultation Note  Bethann Berkshire RN called LC to view latch. Mother had baby in cross cradle but no sucks or swallows noted. Baby very sleepy.  Not waking with feeding cues. Gable MD aroused baby.  LC assisted w/ latching in cross cradle.  Sucks and some swallows observed for approx 10 min. Once baby became sleepy, suggested mother supplement with either pumped breastmilk or formula after burping Changed supplement to Neosure 22 cal due to infant's size. With lots of encouragement and stimulation baby took approx 20 ml of formula.  Mother needs lots of help with with bottle feeding. Mother needs more guidance to build her confidence with feeding. Encouraged mother to post pump with DEBP but mother states she prefers manual to protect her milk supply.    Patient Name: Lorraine Walker ZOXWR'U Date: 03/10/2015 Reason for consult: Follow-up assessment   Maternal Data    Feeding Feeding Type: Breast Fed Length of feed: 15 min  LATCH Score/Interventions Latch: Grasps breast easily, tongue down, lips flanged, rhythmical sucking. Intervention(s): Skin to skin Intervention(s): Adjust position;Assist with latch;Breast compression  Audible Swallowing: Spontaneous and intermittent  Type of Nipple: Everted at rest and after stimulation  Comfort (Breast/Nipple): Soft / non-tender     Hold (Positioning): Assistance needed to correctly position infant at breast and maintain latch.  LATCH Score: 9  Lactation Tools Discussed/Used     Consult Status Consult Status: Follow-up Date: 03/10/15 Follow-up type: In-patient    Dahlia Byes [facility] 03/10/2015, 11:16 AM

## 2015-03-13 ENCOUNTER — Other Ambulatory Visit: Payer: Medicaid Other

## 2015-04-17 ENCOUNTER — Encounter: Payer: Self-pay | Admitting: Obstetrics & Gynecology

## 2015-04-17 ENCOUNTER — Ambulatory Visit (INDEPENDENT_AMBULATORY_CARE_PROVIDER_SITE_OTHER): Payer: Medicaid Other | Admitting: Obstetrics & Gynecology

## 2015-04-17 NOTE — Progress Notes (Signed)
Patient ID: Lorraine Walker, female   DOB: 02/13/1996, 19 y.o.   MRN: [id number] Subjective:     Lorraine Walker is a 19 y.o. female who presents for a postpartum visit. She is 6 weeks postpartum following a spontaneous vaginal delivery. I have fully reviewed the prenatal and intrapartum course. The delivery was at 40 gestational weeks. Outcome: spontaneous vaginal delivery. Anesthesia: epidural. Postpartum course has been good. Baby's course has been good. Baby is feeding by breast/bottle. Bleeding no bleeding. Bowel function is normal. Bladder function is normal. Patient is sexually active. Contraception method is condoms. Postpartum depression screening: negative.  The following portions of the patient's history were reviewed and updated as appropriate: allergies, current medications, past family history, past medical history, past social history, past surgical history and problem list.  Review of Systems Pertinent items are noted in HPI.   Objective:    BP 130/78 mmHg  Pulse 98  Temp(Src) 98.4 F (36.9 C)  Ht 5' (1.524 m)  Wt 197 lb 9.6 oz (89.631 kg)  BMI 38.59 kg/m2  Breastfeeding? Yes  General:  alert, cooperative and no distress       Assessment:     normal postpartum exam. Pap smear not done at today's visit.   Plan:    1. Contraception: condoms 2. Breastfeeding 3. Follow up as needed.    Adam Phenix, MD 04/17/2015

## 2015-11-28 IMAGING — US US OB DETAIL+14 WK
1 series · 12 of 28 positions shown · non-contrast
Comparison: none

[Series 1: us ob detail +14 wk · 98 acquisitions, 12 frames shown]
[im 4/98]
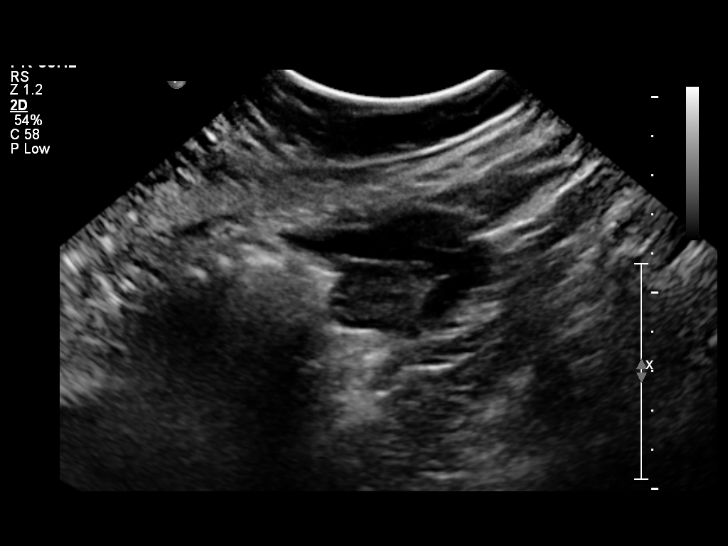
[im 11/98]
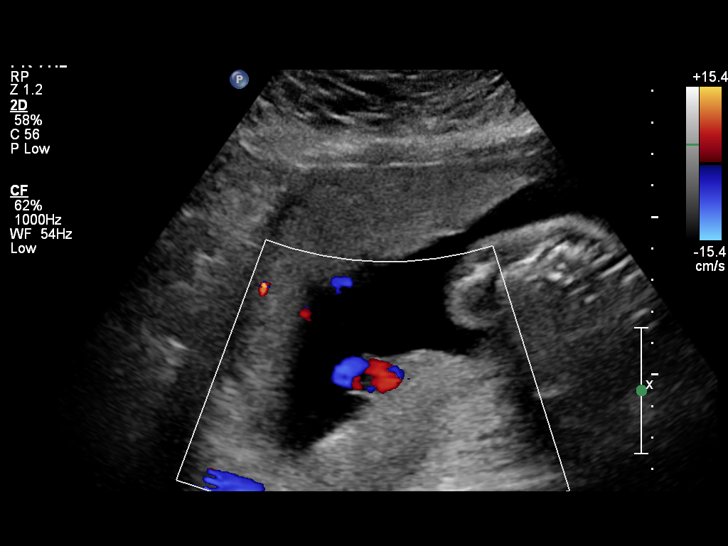
[im 18/98]
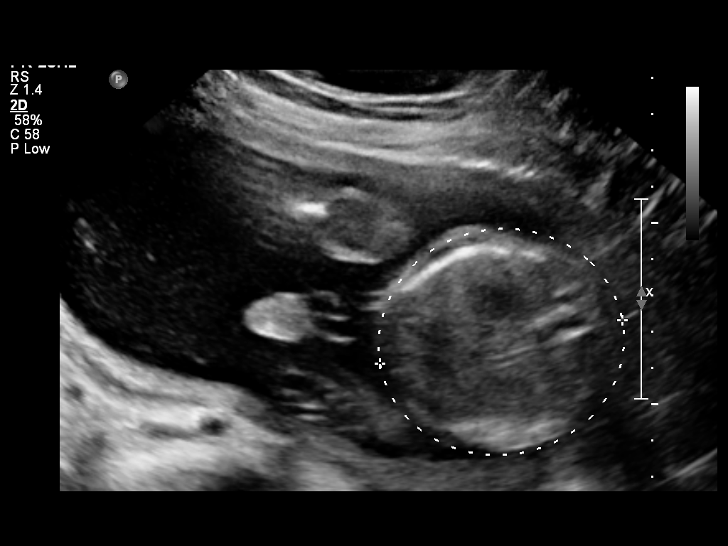
[im 29/98]
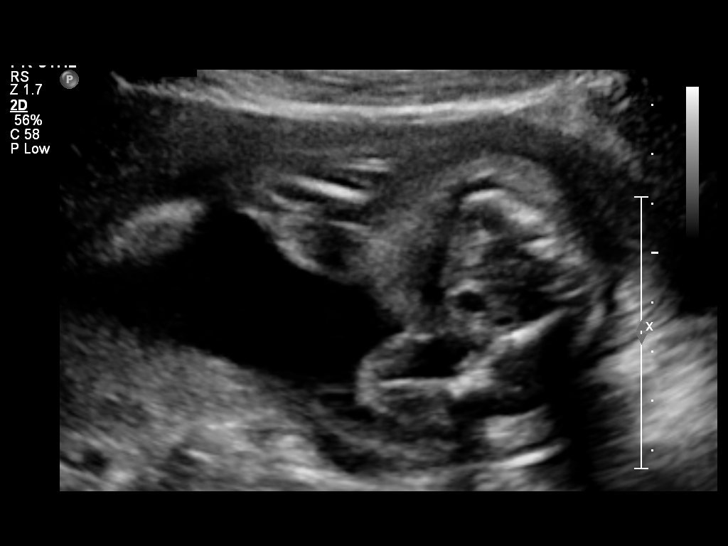
[im 36/98]
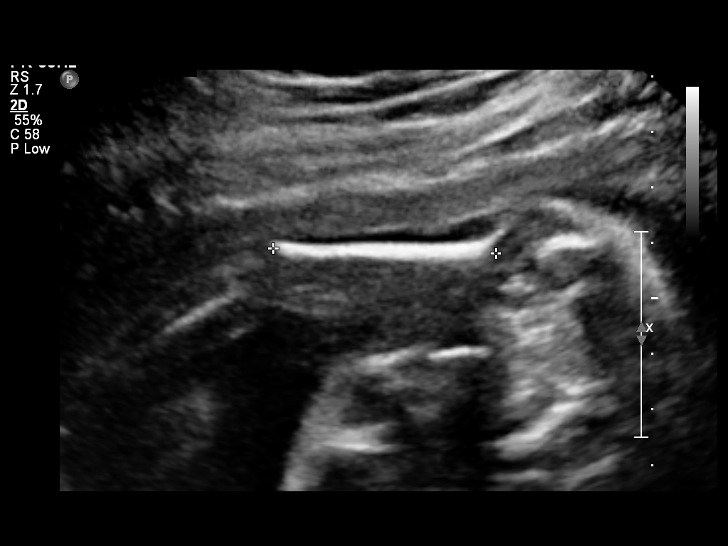
[im 44/98]
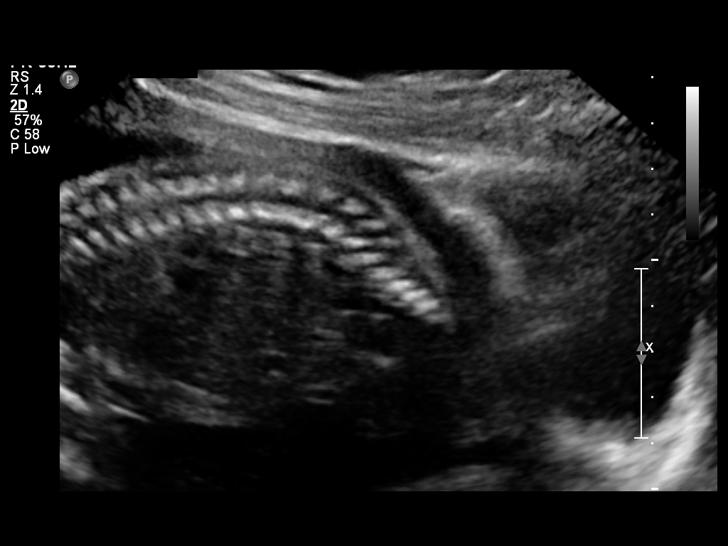
[im 54/98]
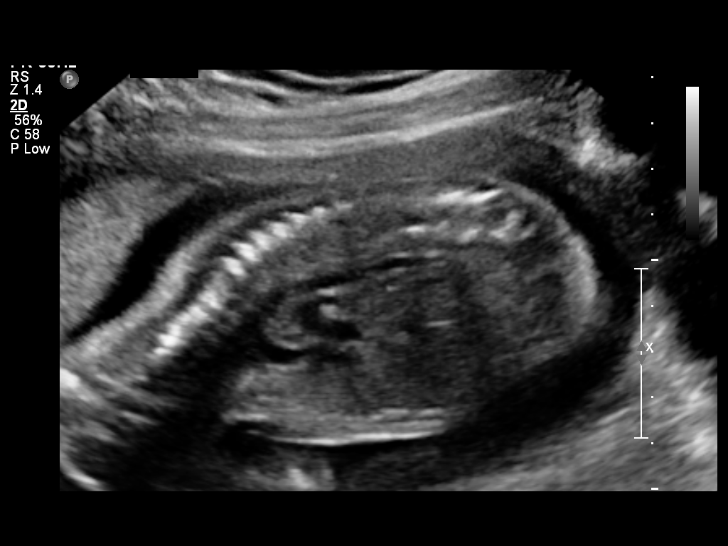
[im 62/98]
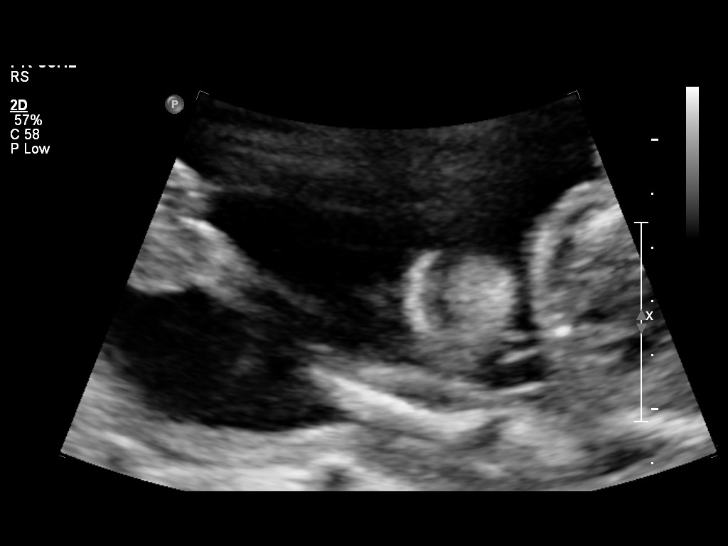
[im 69/98]
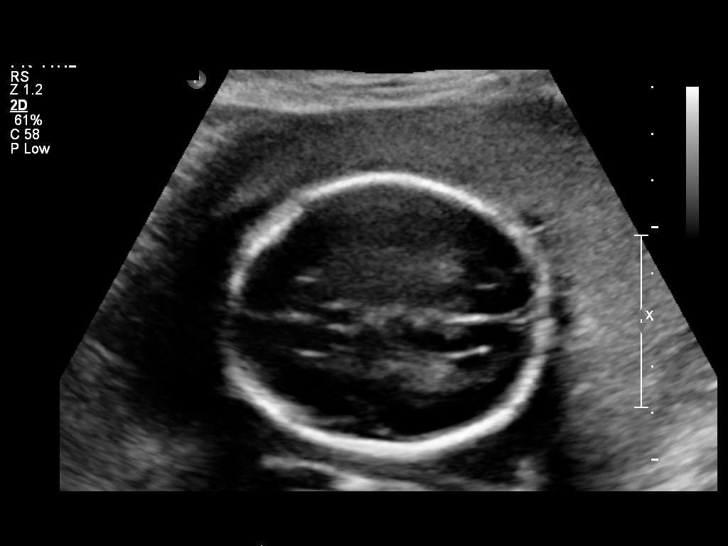
[im 80/98]
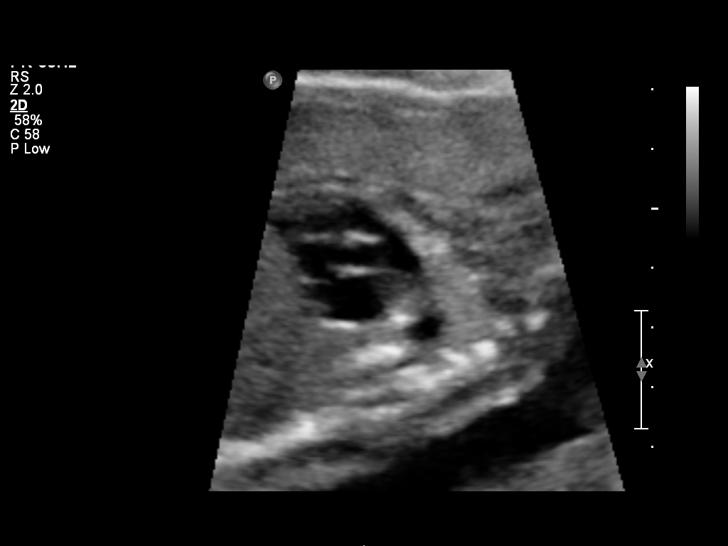
[im 87/98]
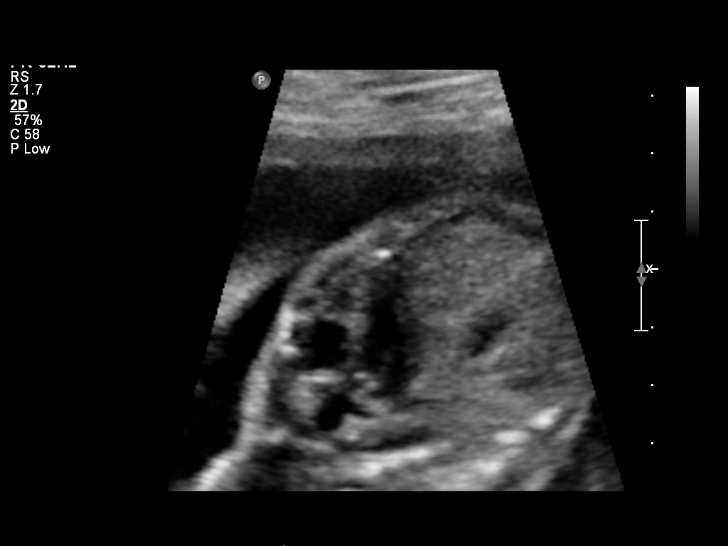
[im 94/98]
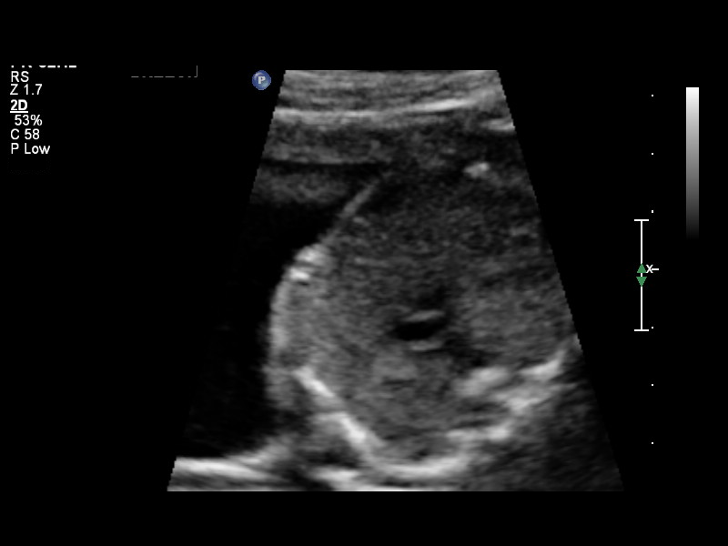

[12 of 28 positions shown; findings below may reference images not displayed]

OBSTETRICS REPORT
                      (Signed Final 11/07/2014 [DATE])

Service(s) Provided

 US OB DETAIL + 14 WK                                  76811.0
Indications

 23 weeks gestation of pregnancy
 Detailed fetal anatomic survey                        Z36
 Obesity complicating pregnancy, second trimester
 No or Little Prenatal Care
Fetal Evaluation

 Num Of Fetuses:    1
 Fetal Heart Rate:  147                          bpm
 Cardiac Activity:  Observed
 Presentation:      Breech
 Placenta:          Right lateral, above
                    cervical os
 P. Cord            Visualized
 Insertion:

 Amniotic Fluid
 AFI FV:      Subjectively within normal limits
                                             Larg Pckt:     6.1  cm
Biometry

 BPD:     60.3  mm     G. Age:  24w 4d                CI:        80.55   70 - 86
                                                      FL/HC:      20.1   18.7 -

 HC:     212.2  mm     G. Age:  23w 2d       24  %    HC/AC:      1.03   1.05 -

 AC:     205.3  mm     G. Age:  25w 1d       85  %    FL/BPD:     70.8   71 - 87
 FL:      42.7  mm     G. Age:  24w 0d       51  %    FL/AC:      20.8   20 - 24
 HUM:     39.9  mm     G. Age:  24w 2d       59  %
 CER:     25.7  mm     G. Age:  23w 5d       52  %

 Est. FW:     701  gm      1 lb 9 oz     68  %
Gestational Age

 LMP:           23w 4d        Date:  05/26/14                 EDD:   03/02/15
 U/S Today:     24w 2d                                        EDD:   02/25/15
 Best:          23w 4d     Det. By:  LMP  (05/26/14)          EDD:   03/02/15
Anatomy
 Cranium:          Appears normal         Aortic Arch:      Appears normal
 Fetal Cavum:      Appears normal         Ductal Arch:      Appears normal
 Ventricles:       Appears normal         Diaphragm:        Appears normal
 Choroid Plexus:   Appears normal         Stomach:          Appears normal, left
                                                            sided
 Cerebellum:       Appears normal         Abdomen:          Appears normal
 Posterior Fossa:  Appears normal         Abdominal Wall:   Appears nml (cord
                                                            insert, abd wall)
 Nuchal Fold:      Not applicable (>20    Cord Vessels:     Appears normal (3
                   wks GA)                                  vessel cord)
 Face:             Appears normal         Kidneys:          Appear normal
                   (orbits and profile)
 Lips:             Appears normal         Bladder:          Appears normal
 Heart:            Appears normal         Spine:            Appears normal
                   (4CH, axis, and
                   situs)
 RVOT:             Appears normal         Lower             Appears normal
                                          Extremities:
 LVOT:             Appears normal         Upper             Appears normal
                                          Extremities:

 Other:  Female gender. Heels visualized.
Targeted Anatomy

 Fetal Central Nervous System
 Cisterna Magna:
Cervix Uterus Adnexa

 Cervical Length:    3.38     cm

 Cervix:       Normal appearance by transabdominal scan.
 Left Ovary:    Size(cm) L: 1.45 x W: 2.22 x H: 1.09  Volume(cc):
 Right Ovary:   Size(cm) L: 2.31 x W: 2.12 x H: 1.69  Volume(cc):
Comments

 The patient's fetal anatomic survey is now complete.
Impression

 Single living intrauterine pregnancy at 23 weeks 4 days.
 Appropriate fetal growth (68%).
 Normal amniotic fluid volume.
 Normal fetal anatomy.
 No fetal anomalies or soft markers of aneuploidy seen.
Recommendations

 Recommend follow-up ultrasound examination in 6 week to
 reassess fetal growth due the late gestational age of this
 ultrasound.

 questions or concerns.
                Ceola, Felner

## 2015-12-09 ENCOUNTER — Encounter: Payer: Self-pay | Admitting: *Deleted

## 2015-12-09 ENCOUNTER — Encounter: Payer: Self-pay | Admitting: Family

## 2015-12-09 DIAGNOSIS — O0990 Supervision of high risk pregnancy, unspecified, unspecified trimester: Secondary | ICD-10-CM | POA: Insufficient documentation

## 2015-12-30 ENCOUNTER — Encounter: Payer: Medicaid Other | Admitting: Family

## 2016-02-19 IMAGING — US US OB LIMITED
1 series · 13 of 28 positions shown · non-contrast
Comparison: none

[Series 1: us ob follow up · 47 acquisitions, 13 frames shown]
[im 2/47]
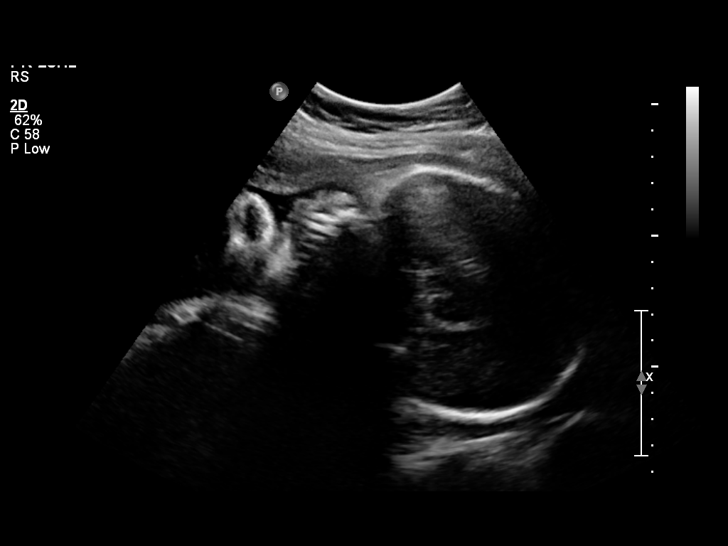
[im 6/47]
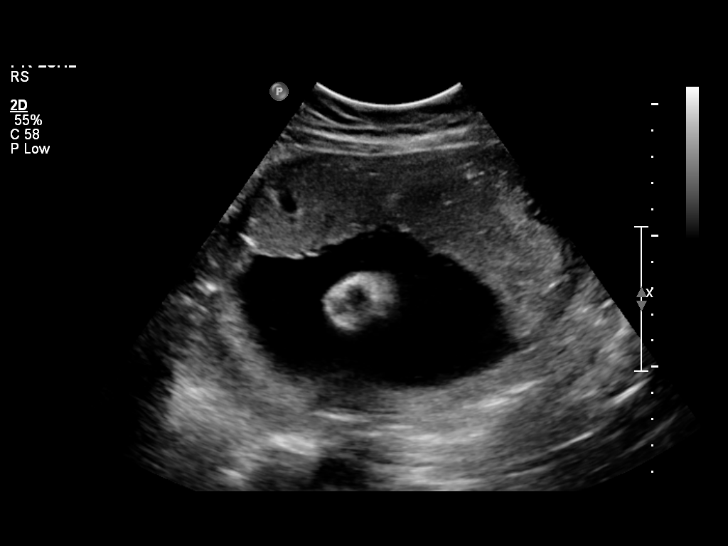
[im 9/47]
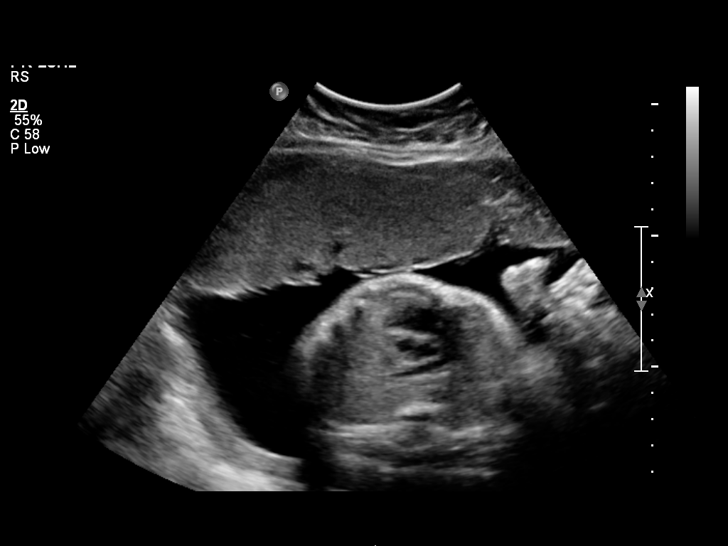
[im 12/47]
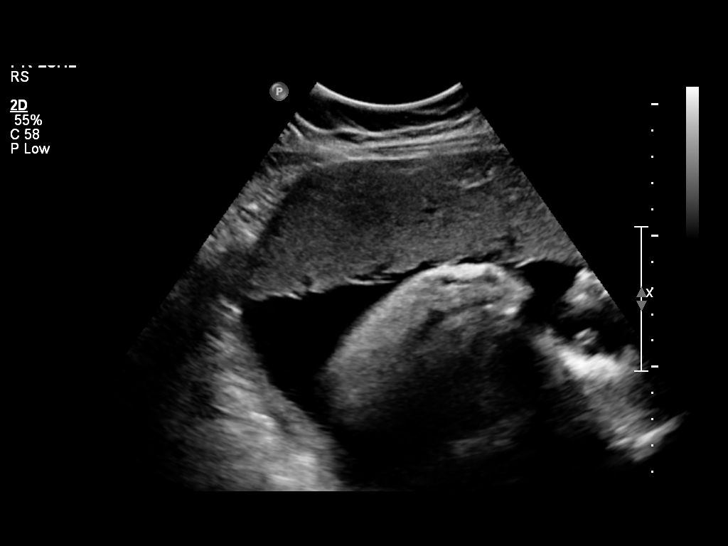
[im 16/47]
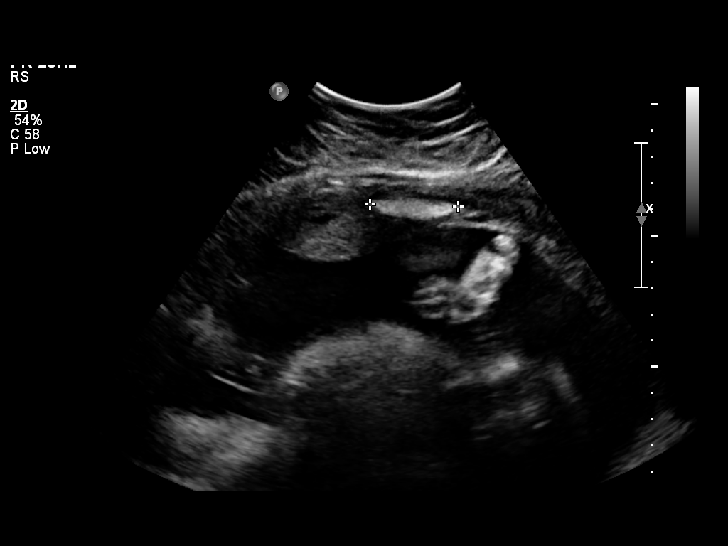
[im 19/47]
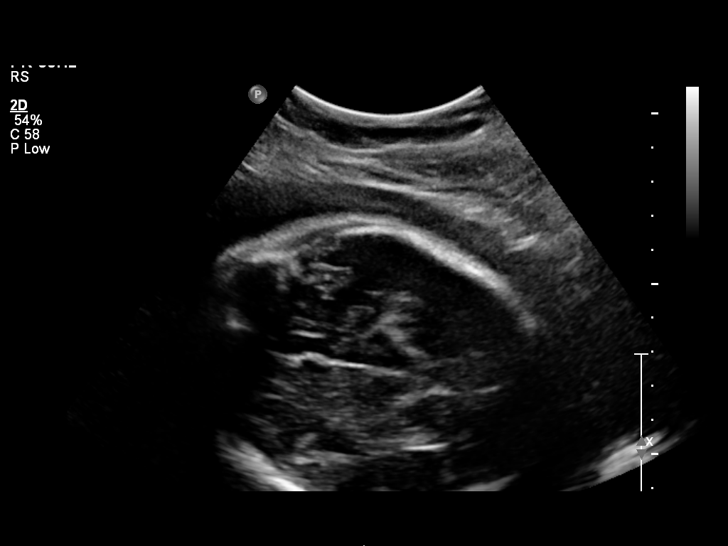
[im 24/47]
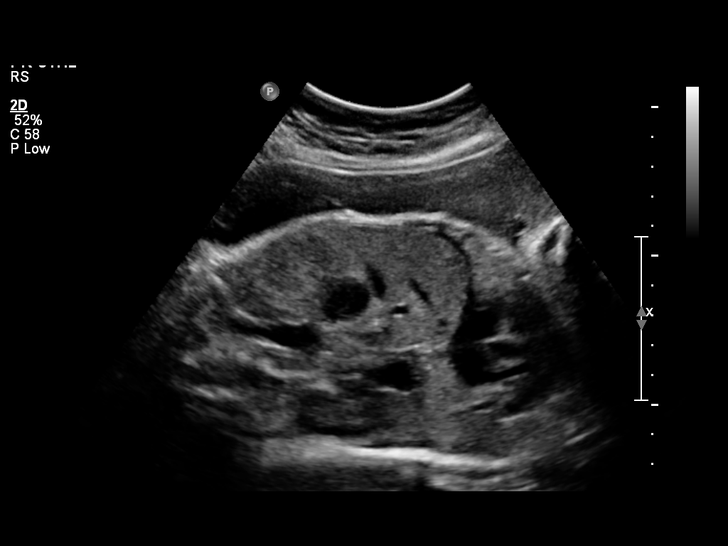
[im 28/47]
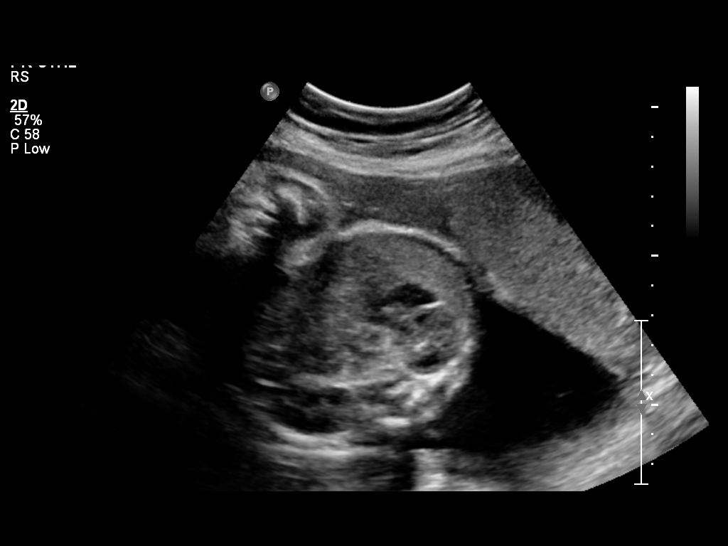
[im 31/47]
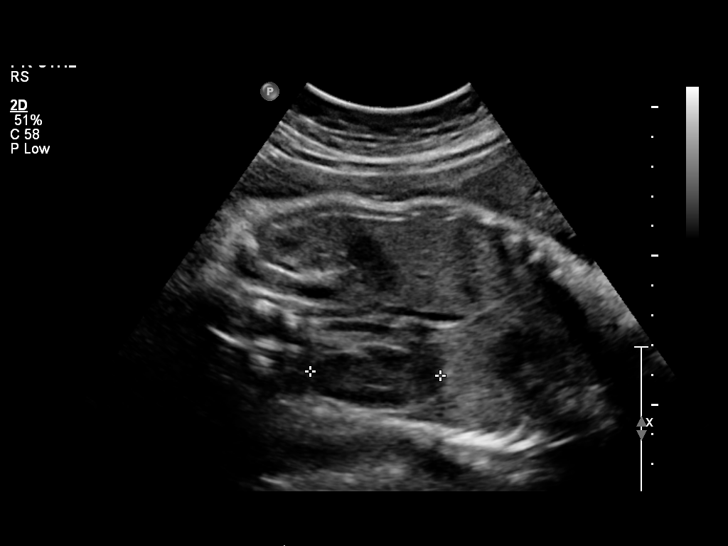
[im 35/47]
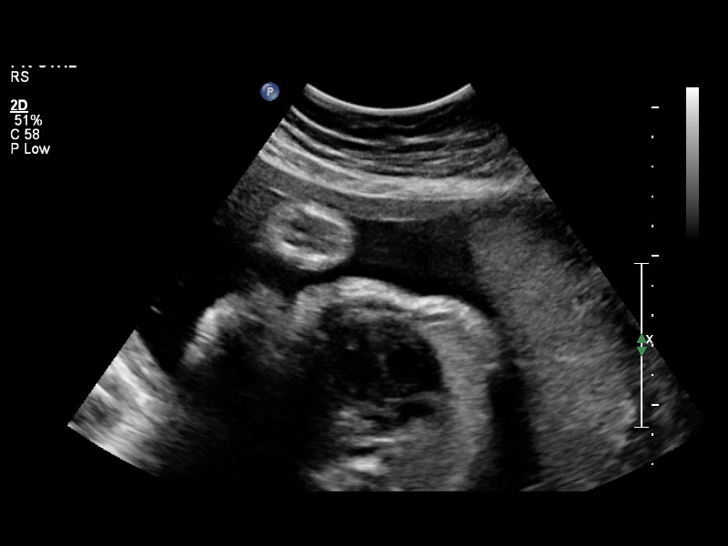
[im 38/47]
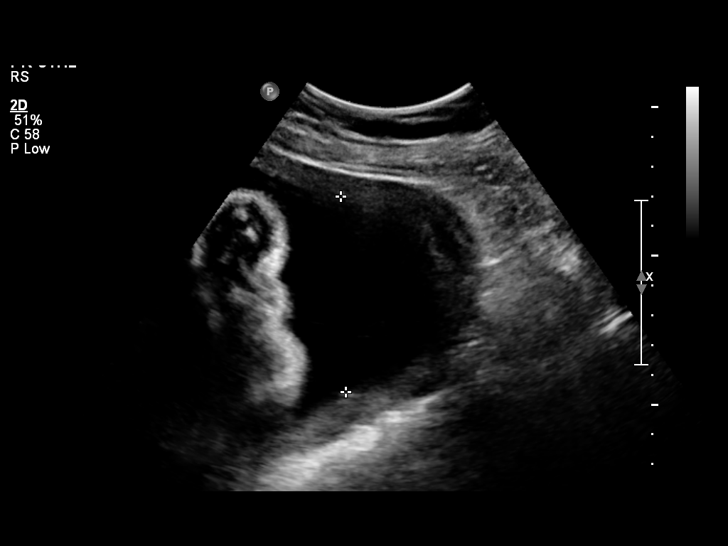
[im 41/47]
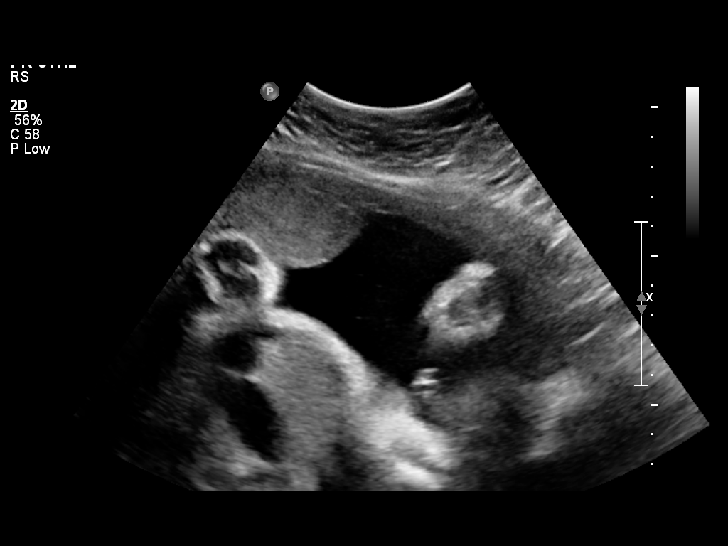
[im 45/47]
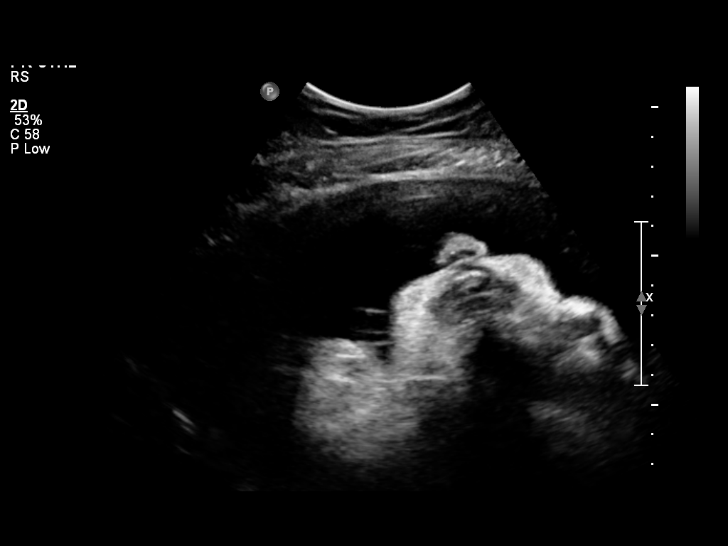

[13 of 28 positions shown; findings below may reference images not displayed]

OBSTETRICS REPORT
(Signed Final 01/29/2015 [DATE])

Name:       VOLODYMYR BEYENE                      Visit  01/29/2015 [DATE]
Date:

Service(s) Provided

[HOSPITAL]                                          76815.0
Indications

Polyhydramnios, third trimester, antepartum
condition or complication, unspecified fetus
Size greater than dates (Large for gestational age)
Obesity complicating pregnancy, second
trimester
No or Little Prenatal Care
35 weeks gestation of pregnancy
Fetal Evaluation

Num Of             1
Fetuses:
Fetal Heart        156                          bpm
Rate:
Cardiac Activity:  Observed
Presentation:      Cephalic
Placenta:          Anterior, above cervical
os
P. Cord            Previously Visualized
Insertion:

Comment     Ecogenic focus present at the placental edge.
:

Amniotic Fluid
AFI FV:      Subjectively upper-normal
AFI Sum:     23.38    cm      89  %Tile     Larg Pckt:    6.55   cm
RUQ:   6.24    cm    RLQ:   6.55    cm   LUQ:    5.26    cm   LLQ:    5.33   cm
Gestational Age

LMP:           35w 3d        Date:  05/26/14                  EDD:   03/02/15
Best:          35w 3d    Det. By:   LMP  (05/26/14)           EDD:   03/02/15
Anatomy
Cranium:          Previously seen        Aortic Arch:       Previously seen
Fetal Cavum:      Previously seen        Ductal Arch:       Previously seen
Ventricles:       Previously seen        Diaphragm:         Appears normal
Choroid Plexus:   Previously seen        Stomach:           Appears normal,
left sided
Cerebellum:       Previously seen        Abdomen:           Previously seen
Posterior         Previously seen        Abdominal          Previously seen
Fossa:                                   Wall:
Nuchal Fold:      Not applicable (>20    Cord Vessels:      Previously seen
wks GA)
Face:             Orbits and profile     Kidneys:           Appear normal
previously seen
Lips:             Appears normal         Bladder:           Appears normal
Heart:            Appears normal         Spine:             Previously seen
(4CH, axis, and
situs)
RVOT:             Previously seen        Lower              Previously seen
Extremities:
LVOT:             Previously seen        Upper              Previously seen
Extremities:

Other:   Fetus appears to be a female. Heels previously seen.
Impression

Single IUP at 35w 3d
Limited ultrasound performed for amniotic fluid volume
assessment
Cephalic presentation
Subjectively increased amniotic fluid volume (AFI 23.4 cm),
but not polyhydramnios.
Recommendations

Follow-up ultrasounds as clinically indicated.

## 2016-08-21 DIAGNOSIS — J45909 Unspecified asthma, uncomplicated: Secondary | ICD-10-CM

## 2016-08-21 DIAGNOSIS — J45901 Unspecified asthma with (acute) exacerbation: Secondary | ICD-10-CM

## 2016-08-21 DIAGNOSIS — D72829 Elevated white blood cell count, unspecified: Secondary | ICD-10-CM | POA: Diagnosis not present

## 2016-08-22 DIAGNOSIS — J45901 Unspecified asthma with (acute) exacerbation: Secondary | ICD-10-CM

## 2016-08-22 DIAGNOSIS — J069 Acute upper respiratory infection, unspecified: Secondary | ICD-10-CM | POA: Diagnosis not present

## 2016-08-22 DIAGNOSIS — D72829 Elevated white blood cell count, unspecified: Secondary | ICD-10-CM | POA: Diagnosis not present

## 2016-10-13 ENCOUNTER — Encounter (HOSPITAL_COMMUNITY): Payer: Self-pay

## 2018-10-22 DIAGNOSIS — J45902 Unspecified asthma with status asthmaticus: Secondary | ICD-10-CM | POA: Diagnosis not present

## 2018-10-22 DIAGNOSIS — J45901 Unspecified asthma with (acute) exacerbation: Secondary | ICD-10-CM

## 2018-10-22 DIAGNOSIS — D72829 Elevated white blood cell count, unspecified: Secondary | ICD-10-CM | POA: Diagnosis not present

## 2018-10-23 DIAGNOSIS — D72829 Elevated white blood cell count, unspecified: Secondary | ICD-10-CM | POA: Diagnosis not present

## 2018-10-23 DIAGNOSIS — J45901 Unspecified asthma with (acute) exacerbation: Secondary | ICD-10-CM | POA: Diagnosis not present

## 2018-10-23 DIAGNOSIS — J45902 Unspecified asthma with status asthmaticus: Secondary | ICD-10-CM | POA: Diagnosis not present

## 2019-10-24 ENCOUNTER — Emergency Department (HOSPITAL_COMMUNITY)
Admission: EM | Admit: 2019-10-24 | Discharge: 2019-10-25 | Discharge status: Against Medical Advice | Payer: Medicaid Other | Attending: Emergency Medicine | Admitting: Emergency Medicine

## 2019-10-24 ENCOUNTER — Encounter (HOSPITAL_COMMUNITY): Payer: Self-pay

## 2019-10-24 ENCOUNTER — Other Ambulatory Visit: Payer: Self-pay

## 2019-10-24 DIAGNOSIS — F17210 Nicotine dependence, cigarettes, uncomplicated: Secondary | ICD-10-CM | POA: Diagnosis not present

## 2019-10-24 DIAGNOSIS — Z91010 Allergy to peanuts: Secondary | ICD-10-CM | POA: Diagnosis not present

## 2019-10-24 DIAGNOSIS — J45909 Unspecified asthma, uncomplicated: Secondary | ICD-10-CM | POA: Diagnosis not present

## 2019-10-24 DIAGNOSIS — R441 Visual hallucinations: Secondary | ICD-10-CM | POA: Diagnosis not present

## 2019-10-24 DIAGNOSIS — Z046 Encounter for general psychiatric examination, requested by authority: Secondary | ICD-10-CM | POA: Diagnosis present

## 2019-10-24 DIAGNOSIS — F419 Anxiety disorder, unspecified: Secondary | ICD-10-CM | POA: Diagnosis not present

## 2019-10-24 DIAGNOSIS — Z79899 Other long term (current) drug therapy: Secondary | ICD-10-CM | POA: Diagnosis not present

## 2019-10-24 DIAGNOSIS — F319 Bipolar disorder, unspecified: Secondary | ICD-10-CM | POA: Diagnosis not present

## 2019-10-24 DIAGNOSIS — Z20822 Contact with and (suspected) exposure to covid-19: Secondary | ICD-10-CM | POA: Diagnosis not present

## 2019-10-24 DIAGNOSIS — Z5320 Procedure and treatment not carried out because of patient's decision for unspecified reasons: Secondary | ICD-10-CM | POA: Diagnosis not present

## 2019-10-24 LAB — CBC
HCT: 43.8 % (ref 36.0–46.0)
Hemoglobin: 14.5 g/dL (ref 12.0–15.0)
MCH: 29.1 pg (ref 26.0–34.0)
MCHC: 33.1 g/dL (ref 30.0–36.0)
MCV: 87.8 fL (ref 80.0–100.0)
Platelets: 356 10*3/uL (ref 150–400)
RBC: 4.99 MIL/uL (ref 3.87–5.11)
RDW: 13.5 % (ref 11.5–15.5)
WBC: 9.2 10*3/uL (ref 4.0–10.5)
nRBC: 0 % (ref 0.0–0.2)

## 2019-10-24 LAB — I-STAT BETA HCG BLOOD, ED (MC, WL, AP ONLY): I-stat hCG, quantitative: <5 m[IU]/mL (ref <5)

## 2019-10-24 LAB — COMPREHENSIVE METABOLIC PANEL
ALT: 25 U/L (ref 0–44)
AST: 20 U/L (ref 15–41)
Albumin: 3.9 g/dL (ref 3.5–5.0)
Alkaline Phosphatase: 66 U/L (ref 38–126)
Anion gap: 10 (ref 5–15)
BUN: 11 mg/dL (ref 6–20)
CO2: 22 mmol/L (ref 22–32)
Calcium: 9.8 mg/dL (ref 8.9–10.3)
Chloride: 107 mmol/L (ref 98–111)
Creatinine, Ser: 0.78 mg/dL (ref 0.44–1.00)
GFR calc Af Amer: >60 mL/min (ref >60)
GFR calc non Af Amer: >60 mL/min (ref >60)
Glucose, Bld: 107 mg/dL — ABNORMAL HIGH (ref 70–99)
Potassium: 4.1 mmol/L (ref 3.5–5.1)
Sodium: 139 mmol/L (ref 135–145)
Total Bilirubin: 0.6 mg/dL (ref 0.3–1.2)
Total Protein: 7 g/dL (ref 6.5–8.1)

## 2019-10-24 LAB — ACETAMINOPHEN LEVEL: Acetaminophen (Tylenol), Serum: <10 ug/mL — ABNORMAL LOW (ref 10–30)

## 2019-10-24 LAB — SALICYLATE LEVEL: Salicylate Lvl: <7 mg/dL — ABNORMAL LOW (ref 7.0–30.0)

## 2019-10-24 LAB — RAPID URINE DRUG SCREEN, HOSP PERFORMED
Amphetamines: NOT DETECTED
Barbiturates: NOT DETECTED
Benzodiazepines: NOT DETECTED
Cocaine: NOT DETECTED
Opiates: NOT DETECTED
Tetrahydrocannabinol: NOT DETECTED

## 2019-10-24 LAB — ETHANOL: Alcohol, Ethyl (B): <10 mg/dL (ref <10)

## 2019-10-24 MED ORDER — HYDROXYZINE HCL 25 MG PO TABS: 25.0000 mg | ORAL_TABLET | Freq: Three times a day (TID) | ORAL | 0 refills | Status: DC | PRN

## 2019-10-24 MED ORDER — HYDROXYZINE HCL 25 MG PO TABS
25.0000 mg | ORAL_TABLET | Freq: Once | ORAL | Status: AC
  Administered 2019-10-24: 25 mg via ORAL
  Filled 2019-10-24: qty 1

## 2019-10-24 MED ORDER — NICOTINE 7 MG/24HR TD PT24
7.0000 mg | MEDICATED_PATCH | Freq: Once | TRANSDERMAL | Status: DC
  Administered 2019-10-24: 7 mg via TRANSDERMAL
  Filled 2019-10-24: qty 1

## 2019-10-24 NOTE — ED Notes (Signed)
Pt talking to the phone to family. Tearful, saying she wants to leave and this "isn't right what we're doing here" pt says "she does not want to be treated like a child and she just wants help which we are not providing her." This RN explained that this is protocol in order for her to speak to someone who could help. Pt crying, saying she is going to leave. PA informed.

## 2019-10-24 NOTE — ED Notes (Signed)
This RN called for a sitter to be present for this patient; none available at this time. RN will reassess at a later time. Pt not presenting with any SI or HI at this time.
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
## 2019-10-24 NOTE — ED Triage Notes (Signed)
Pt reports hx of bipolar but not currently taking any medications, pt reports increased anxiety and stress at home, unable to manage her symptoms. Pt anxious and tearful in triage. Pt seen at [facility] but left AMA due to rude staff. Pt denies SI/HI at this time.

## 2019-10-24 NOTE — BHH Counselor (Signed)
Disposition:   Renaye Rakers, NP, recommend overnight observation

## 2019-10-24 NOTE — Discharge Instructions (Addendum)
Prescription given for Hydroxyzine which can help with your anxiety. Take medication as directed and do not operate machinery, drive a car, or work while taking this medication as it can make you drowsy.   You were given information to follow-up with [facility] or Behavioral health in regards to your anxiety.    Return to the emergency department for any new or worsening symptoms.
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
## 2019-10-24 NOTE — ED Notes (Signed)
Pt currently talking to TTS 

## 2019-10-25 ENCOUNTER — Other Ambulatory Visit: Payer: Self-pay

## 2019-10-25 ENCOUNTER — Encounter (HOSPITAL_COMMUNITY): Payer: Self-pay | Admitting: Emergency Medicine

## 2019-10-25 ENCOUNTER — Observation Stay (HOSPITAL_BASED_OUTPATIENT_CLINIC_OR_DEPARTMENT_OTHER)
Admission: AD | Admit: 2019-10-25 | Discharge: 2019-10-26 | Disposition: A | Discharge status: Home / Self Care | Payer: Medicaid Other | Source: Intra-hospital | Attending: Psychiatry | Admitting: Psychiatry

## 2019-10-25 ENCOUNTER — Emergency Department (HOSPITAL_COMMUNITY)
Admission: EM | Admit: 2019-10-25 | Discharge: 2019-10-25 | Disposition: A | Discharge status: Other Acute Inpatient Hospital | Payer: Medicaid Other | Source: Home / Self Care | Attending: Emergency Medicine | Admitting: Emergency Medicine

## 2019-10-25 DIAGNOSIS — F410 Panic disorder [episodic paroxysmal anxiety] without agoraphobia: Secondary | ICD-10-CM

## 2019-10-25 DIAGNOSIS — F419 Anxiety disorder, unspecified: Secondary | ICD-10-CM

## 2019-10-25 DIAGNOSIS — F319 Bipolar disorder, unspecified: Secondary | ICD-10-CM | POA: Diagnosis present

## 2019-10-25 DIAGNOSIS — F411 Generalized anxiety disorder: Secondary | ICD-10-CM

## 2019-10-25 LAB — COMPREHENSIVE METABOLIC PANEL
ALT: 27 U/L (ref 0–44)
AST: 20 U/L (ref 15–41)
Albumin: 4.1 g/dL (ref 3.5–5.0)
Alkaline Phosphatase: 74 U/L (ref 38–126)
Anion gap: 10 (ref 5–15)
BUN: 9 mg/dL (ref 6–20)
CO2: 23 mmol/L (ref 22–32)
Calcium: 9.4 mg/dL (ref 8.9–10.3)
Chloride: 105 mmol/L (ref 98–111)
Creatinine, Ser: 0.77 mg/dL (ref 0.44–1.00)
GFR calc Af Amer: >60 mL/min (ref >60)
GFR calc non Af Amer: >60 mL/min (ref >60)
Glucose, Bld: 99 mg/dL (ref 70–99)
Potassium: 4 mmol/L (ref 3.5–5.1)
Sodium: 138 mmol/L (ref 135–145)
Total Bilirubin: 0.3 mg/dL (ref 0.3–1.2)
Total Protein: 7.4 g/dL (ref 6.5–8.1)

## 2019-10-25 LAB — ETHANOL: Alcohol, Ethyl (B): <10 mg/dL (ref <10)

## 2019-10-25 LAB — I-STAT BETA HCG BLOOD, ED (MC, WL, AP ONLY): I-stat hCG, quantitative: <5 m[IU]/mL (ref <5)

## 2019-10-25 LAB — CBC
HCT: 46 % (ref 36.0–46.0)
Hemoglobin: 15.4 g/dL — ABNORMAL HIGH (ref 12.0–15.0)
MCH: 29.2 pg (ref 26.0–34.0)
MCHC: 33.5 g/dL (ref 30.0–36.0)
MCV: 87.1 fL (ref 80.0–100.0)
Platelets: 352 10*3/uL (ref 150–400)
RBC: 5.28 MIL/uL — ABNORMAL HIGH (ref 3.87–5.11)
RDW: 13.3 % (ref 11.5–15.5)
WBC: 11.4 10*3/uL — ABNORMAL HIGH (ref 4.0–10.5)
nRBC: 0 % (ref 0.0–0.2)

## 2019-10-25 LAB — RAPID URINE DRUG SCREEN, HOSP PERFORMED
Amphetamines: NOT DETECTED
Barbiturates: NOT DETECTED
Benzodiazepines: NOT DETECTED
Cocaine: NOT DETECTED
Opiates: NOT DETECTED
Tetrahydrocannabinol: NOT DETECTED

## 2019-10-25 LAB — SALICYLATE LEVEL: Salicylate Lvl: <7 mg/dL — ABNORMAL LOW (ref 7.0–30.0)

## 2019-10-25 LAB — ACETAMINOPHEN LEVEL: Acetaminophen (Tylenol), Serum: <10 ug/mL — ABNORMAL LOW (ref 10–30)

## 2019-10-25 LAB — SARS CORONAVIRUS 2 (TAT 6-24 HRS): SARS Coronavirus 2: NEGATIVE

## 2019-10-25 MED ORDER — ALPRAZOLAM 0.5 MG PO TABS: 0.5000 mg | ORAL_TABLET | Freq: Every evening | ORAL | Status: DC | PRN

## 2019-10-25 MED ORDER — BUSPIRONE HCL 10 MG PO TABS
10.0000 mg | ORAL_TABLET | Freq: Three times a day (TID) | ORAL | Status: DC | PRN
  Administered 2019-10-25: 10 mg via ORAL
  Filled 2019-10-25: qty 1

## 2019-10-25 MED ORDER — NICOTINE 14 MG/24HR TD PT24
14.0000 mg | MEDICATED_PATCH | Freq: Once | TRANSDERMAL | Status: DC
  Administered 2019-10-25: 14 mg via TRANSDERMAL
  Filled 2019-10-25: qty 1

## 2019-10-25 MED ORDER — ALPRAZOLAM 0.5 MG PO TABS
0.5000 mg | ORAL_TABLET | Freq: Once | ORAL | Status: AC
  Administered 2019-10-25: 0.5 mg via ORAL
  Filled 2019-10-25: qty 2

## 2019-10-25 NOTE — ED Notes (Signed)
Pt continues to state she wants to leave and wants her discharge papers. This RN and Tresa Endo, Consulting civil engineer explained to the pt that she will no receive d/c papers because we are encouraging her to stay. It was explained to pt if she was to leave she would be doing so against medical advice. Pt stated "fine, give me my stuff". Pt using phone to try and call her fiance. Pt belongings given to pt and armband removed. Pt escorted to door.

## 2019-10-25 NOTE — ED Notes (Signed)
Pt using 1 of 2 phone calls.
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
## 2019-10-25 NOTE — ED Notes (Signed)
Pt very agitated. Pt states she wants to leave if she can not keep a phone with her at all time. Pt states she wants to leave . MD aware. Pt is under volentary status at this time. Denies SI/HI. MD speaking to Pt at this time.

## 2019-10-25 NOTE — ED Notes (Signed)
Pt on with TTS.  

## 2019-10-25 NOTE — ED Notes (Signed)
Rules of department explained to pt. This RN explained she gets 2 phone calls @ 5 mins each. Pt upset about the rules but verbalized understanding.

## 2019-10-25 NOTE — BHH Counselor (Signed)
Disposition;   Berneice Heinrich, NP, recommends observation unit [facility]

## 2019-10-25 NOTE — ED Provider Notes (Signed)
[facility] EMERGENCY DEPARTMENT Provider Note   CSN: [id number] Arrival date & time: 10/25/19  1215     History Chief Complaint  Patient presents with  . Anxiety    Lorraine Walker is a 24 y.o. female.  The history is provided by the patient and medical records. No language interpreter was used.  Mental Health Problem Presenting symptoms: self-mutilation   Presenting symptoms: no aggressive behavior, no agitation, no bizarre behavior, no delusions, no depression, no hallucinations, no homicidal ideas, no suicidal thoughts, no suicidal threats and no suicide attempt   Degree of incapacity (severity):  Severe Onset quality:  Gradual Duration:  2 weeks Timing:  Constant Progression:  Worsening Chronicity:  Recurrent Treatment compliance:  Untreated Relieved by:  Nothing Worsened by:  Nothing Ineffective treatments:  None tried Associated symptoms: anxiety   Associated symptoms: no abdominal pain, no chest pain, no fatigue and no headaches   Risk factors: hx of mental illness        Past Medical History:  Diagnosis Date  . Anxiety   . Asthma   . Scoliosis     Patient Active Problem List   Diagnosis Date Noted  . Supervision of high risk pregnancy, antepartum 12/09/2015  . Asthma 10/30/2014  . Arthritis 10/30/2014  . Scoliosis 10/30/2014  . Anxiety 10/30/2014  . Panic attacks 10/30/2014  . Nerve damage 10/30/2014    Past Surgical History:  Procedure Laterality Date  . COSMETIC SURGERY    . FRACTURE SURGERY       OB History    Gravida  2   Para  1   Term  1   Preterm      AB      Living  1     SAB      TAB      Ectopic      Multiple  0   Live Births  1           Family History  Problem Relation Age of Onset  . Hypertension Father   . Cancer Father        skin  . Diabetes Father   . Heart disease Father   . Hypertension Mother   . Heart disease Maternal Grandfather     Social History   Tobacco Use  .  Smoking status: Current Every Day Smoker    Packs/day: 0.25    Types: Cigarettes  . Smokeless tobacco: Never Used  Substance Use Topics  . Alcohol use: No  . Drug use: No    Home Medications Prior to Admission medications   Medication Sig Start Date End Date Taking? Authorizing Provider  albuterol (PROVENTIL HFA;VENTOLIN HFA) 108 (90 BASE) MCG/ACT inhaler Inhale 2 puffs into the lungs every 4 (four) hours as needed for wheezing or shortness of breath.    [provider]  albuterol (PROVENTIL) (5 MG/ML) 0.5% nebulizer solution Take 2.5 mg by nebulization every 6 (six) hours as needed for wheezing or shortness of breath.    [provider]  ALPRAZolam Prudy Feeler) 0.5 MG tablet Take 0.5 mg by mouth at bedtime as needed for anxiety.    [provider]  busPIRone (BUSPAR) 10 MG tablet Take 10 mg by mouth 3 (three) times daily as needed.    [provider]  Cyanocobalamin (VITAMIN B12 PO) Take 1 tablet by mouth daily.    [provider]  fluticasone (FLONASE) 50 MCG/ACT nasal spray Place 1 spray into both nostrils daily.  [provider]  hydrOXYzine (ATARAX/VISTARIL) 25 MG tablet Take 1 tablet (25 mg total) by mouth every 8 (eight) hours as needed for anxiety. 10/24/19   Couture, Cortni S, PA-C  ibuprofen (ADVIL,MOTRIN) 600 MG tablet Take 1 tablet (600 mg total) by mouth every 6 (six) hours. Patient not taking: Reported on 10/24/2019 03/09/15   Keitha Butte, CNM  ipratropium-albuterol (DUONEB) 0.5-2.5 (3) MG/3ML SOLN Take 3 mLs by nebulization every 6 (six) hours as needed (For shortness of breath.).    [provider]  VITAMIN D PO Take 1 tablet by mouth daily.    [provider]    Allergies    Decadron [dexamethasone], Peanuts [peanut oil], and Prednisone  Review of Systems   Review of Systems  Constitutional: Negative for chills, diaphoresis, fatigue and fever.  HENT: Negative for congestion, ear pain and sore throat.    Eyes: Negative for pain and visual disturbance.  Respiratory: Negative for cough, chest tightness and shortness of breath.   Cardiovascular: Negative for chest pain, palpitations and leg swelling.  Gastrointestinal: Negative for abdominal pain and vomiting.  Genitourinary: Negative for dysuria, flank pain, frequency and hematuria.  Musculoskeletal: Negative for arthralgias, back pain, neck pain and neck stiffness.  Skin: Negative for color change and rash.  Neurological: Negative for dizziness, seizures, syncope, speech difficulty, weakness, light-headedness, numbness and headaches.  Psychiatric/Behavioral: Positive for self-injury. Negative for agitation, hallucinations, homicidal ideas and suicidal ideas. The patient is nervous/anxious.   All other systems reviewed and are negative.   Physical Exam Updated Vital Signs BP (!) 143/80 (BP Location: Right Arm)   Pulse (!) 113   Temp 98.6 F (37 C) (Oral)   Resp (!) 22   Ht 5' (1.524 m)   Wt 98.9 kg   SpO2 100%   BMI 42.58 kg/m   Physical Exam Vitals and nursing note reviewed.  Constitutional:      General: She is not in acute distress.    Appearance: She is well-developed. She is not ill-appearing, toxic-appearing or diaphoretic.  HENT:     Head: Normocephalic and atraumatic.     Right Ear: External ear normal.     Left Ear: External ear normal.     Nose: Nose normal.     Mouth/Throat:     Pharynx: No oropharyngeal exudate.  Eyes:     Conjunctiva/sclera: Conjunctivae normal.     Pupils: Pupils are equal, round, and reactive to light.  Cardiovascular:     Rate and Rhythm: Tachycardia present.     Pulses: Normal pulses.     Heart sounds: No murmur.  Pulmonary:     Effort: No respiratory distress.     Breath sounds: No stridor. No wheezing, rhonchi or rales.  Chest:     Chest wall: No tenderness.  Abdominal:     General: Abdomen is flat. There is no distension.     Tenderness: There is no abdominal tenderness. There is  no right CVA tenderness, left CVA tenderness or rebound.  Musculoskeletal:        General: No tenderness.     Cervical back: Normal range of motion and neck supple.     Right lower leg: No edema.     Left lower leg: No edema.  Skin:    General: Skin is warm.     Coloration: Skin is not pale.     Findings: No erythema or rash.     Comments: Patient has wounds horizontally on both forearms from cutting but there  are no deep laceration seen.  They are hemostatic.  Good grip strength and sensation in hands.  Good pulses.  Neurological:     General: No focal deficit present.     Mental Status: She is alert and oriented to person, place, and time.     Sensory: No sensory deficit.     Motor: No weakness or abnormal muscle tone.     Coordination: Coordination normal.     Deep Tendon Reflexes: Reflexes are normal and symmetric.  Psychiatric:        Attention and Perception: Perception normal.        Mood and Affect: Mood is anxious.        Behavior: Behavior is not agitated.        Thought Content: Thought content does not include homicidal or suicidal ideation. Thought content does not include homicidal or suicidal plan.     ED Results / Procedures / Treatments   Labs (all labs ordered are listed, but only abnormal results are displayed) Labs Reviewed  SALICYLATE LEVEL - Abnormal; Notable for the following components:      Result Value   Salicylate Lvl <7.0 (*)    All other components within normal limits  ACETAMINOPHEN LEVEL - Abnormal; Notable for the following components:   Acetaminophen (Tylenol), Serum <10 (*)    All other components within normal limits  CBC - Abnormal; Notable for the following components:   WBC 11.4 (*)    RBC 5.28 (*)    Hemoglobin 15.4 (*)    All other components within normal limits  COMPREHENSIVE METABOLIC PANEL  ETHANOL  RAPID URINE DRUG SCREEN, HOSP PERFORMED  I-STAT BETA HCG BLOOD, ED ([facility], [facility], [facility] ONLY)    EKG None  Radiology No results  found.  Procedures Procedures (including critical care time)  Medications Ordered in ED Medications  nicotine (NICODERM CQ - dosed in mg/24 hours) patch 14 mg (14 mg Transdermal Patch Applied 10/25/19 1635)  ALPRAZolam (XANAX) tablet 0.5 mg (has no administration in time range)  busPIRone (BUSPAR) tablet 10 mg (has no administration in time range)  ALPRAZolam (XANAX) tablet 0.5 mg (0.5 mg Oral Given 10/25/19 1635)    ED Course  I have reviewed the triage vital signs and the nursing notes.  Pertinent labs & imaging results that were available during my care of the patient were reviewed by me and considered in my medical decision making (see chart for details).    MDM Rules/Calculators/A&P                      Kary A Esquer is a 24 y.o. female with a past medical history significant for asthma, anxiety, and scoliosis and possible bipolar disorder who presents with continued anxiety and panic attacks.  Patient reports that she was seen overnight and was going to be observed overnight and reassessed in the morning but got anxious and left the emergency department.  She reports that after leaving, she continued to have anxiety and panic attacks and presents back for reevaluation.  She says that her stress is been worse over the last 2 weeks and she is not sure what is causing her to worsen.   She currently denies SI and HI.  She had a reports that she has cut in the past but never to try and hurt herself.  She denies any recent suicide attempt.  She denies any other complaints.  No physical concerns at this time.  On exam, lungs are  clear and chest is nontender.  Abdomen is nontender.  Patient is very jittery and anxious.  Patient moving all extremities.  She is alert and oriented.  She will have repeat screening labs and TTS will be called again.  Anticipate following up on TTS recommendations.  Repeat labs reassuring. She is medically cleared for psych recommendations. Nicotine patch and  home meds ordered.    Final Clinical Impression(s) / ED Diagnoses Final diagnoses:  Anxiety  Panic attack     Clinical Impression: 1. Anxiety   2. Panic attack     Disposition: Awaiting TTS recommendations for anxiety and panic attacks  This note was prepared with assistance of Dragon voice recognition software. Occasional wrong-word or sound-a-like substitutions may have occurred due to the inherent limitations of voice recognition software.     Elizabella Nolet, Canary Brim, MD 10/25/19 1659

## 2019-10-25 NOTE — ED Notes (Signed)
Breakfast ordered 

## 2019-10-25 NOTE — ED Triage Notes (Signed)
Pt states she was here last night for anxiety and panic attacks. States she went home and her boyfriend sent her back and told her to stay. Denies SI/HI.

## 2019-10-25 NOTE — ED Notes (Signed)
Patient to be transferred to [facility] Obs after 2230 at staff request; Report has been given to receiving RN at [facility]

## 2019-10-25 NOTE — ED Notes (Signed)
Pt sleeping well; observed symetrical rise and fall of chest.  

## 2019-10-26 ENCOUNTER — Encounter (HOSPITAL_COMMUNITY): Payer: Self-pay | Admitting: Family

## 2019-10-26 ENCOUNTER — Other Ambulatory Visit: Payer: Self-pay

## 2019-10-26 DIAGNOSIS — F3189 Other bipolar disorder: Secondary | ICD-10-CM | POA: Diagnosis not present

## 2019-10-26 DIAGNOSIS — F411 Generalized anxiety disorder: Secondary | ICD-10-CM | POA: Diagnosis not present

## 2019-10-26 DIAGNOSIS — F319 Bipolar disorder, unspecified: Secondary | ICD-10-CM | POA: Diagnosis present

## 2019-10-26 MED ORDER — ALPRAZOLAM 0.5 MG PO TABS
0.5000 mg | ORAL_TABLET | Freq: Every evening | ORAL | Status: DC | PRN
  Filled 2019-10-26: qty 1

## 2019-10-26 MED ORDER — BUSPIRONE HCL 5 MG PO TABS
10.0000 mg | ORAL_TABLET | Freq: Three times a day (TID) | ORAL | Status: DC
  Filled 2019-10-26: qty 2

## 2019-10-26 MED ORDER — ALUM & MAG HYDROXIDE-SIMETH 200-200-20 MG/5ML PO SUSP: 30.0000 mL | ORAL | Status: DC | PRN

## 2019-10-26 MED ORDER — ALBUTEROL SULFATE (2.5 MG/3ML) 0.083% IN NEBU
2.5000 mg | INHALATION_SOLUTION | Freq: Four times a day (QID) | RESPIRATORY_TRACT | Status: DC | PRN
  Filled 2019-10-26: qty 3

## 2019-10-26 MED ORDER — ACETAMINOPHEN 325 MG PO TABS: 650.0000 mg | ORAL_TABLET | Freq: Four times a day (QID) | ORAL | Status: DC | PRN

## 2019-10-26 MED ORDER — VITAMIN D 25 MCG (1000 UNIT) PO TABS
1000.0000 [IU] | ORAL_TABLET | Freq: Every day | ORAL | Status: DC
  Filled 2019-10-26 (×3): qty 1

## 2019-10-26 MED ORDER — BUSPIRONE HCL 10 MG PO TABS: 10.0000 mg | ORAL_TABLET | Freq: Three times a day (TID) | ORAL | Status: AC

## 2019-10-26 MED ORDER — ALBUTEROL SULFATE HFA 108 (90 BASE) MCG/ACT IN AERS: 2.0000 | INHALATION_SPRAY | RESPIRATORY_TRACT | Status: DC | PRN

## 2019-10-26 MED ORDER — VITAMIN B-12 100 MCG PO TABS
100.0000 ug | ORAL_TABLET | Freq: Every day | ORAL | Status: DC
  Filled 2019-10-26 (×3): qty 1

## 2019-10-26 MED ORDER — HYDROXYZINE HCL 25 MG PO TABS: 25.0000 mg | ORAL_TABLET | Freq: Three times a day (TID) | ORAL | Status: DC | PRN

## 2019-10-26 MED ORDER — MAGNESIUM HYDROXIDE 400 MG/5ML PO SUSP: 30.0000 mL | Freq: Every day | ORAL | Status: DC | PRN
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
## 2019-10-26 NOTE — Progress Notes (Signed)
CSW met with pt and delivered outpatient resources for mental health services.    Darletta Moll MSW, [place] Worker Disposition  [facility] Ph: [phone] Fax: [phone]

## 2019-10-26 NOTE — Progress Notes (Signed)
D: Pt A & O X 3. Denies SI, HI, AVH and pain at this time. D/C home as ordered. Awaiting pickup by family in the lobby.  A: D/C instructions reviewed with pt including follow up resources appointment, compliance encouraged. All belongings from locker 53 returned to pt at time of departure. Pt refused all scheduled and PRN medications when offered. Safety checks maintained without incident till time of d/c.  R: Pt resistive to care on multiple approach.Verbalized understanding related to d/c instructions. Refused to signed belonging sheet in agreement with items received from locker. Ambulatory with a steady gait. Appears to be in no physical distress at time of departure.
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
## 2019-10-26 NOTE — Progress Notes (Signed)
Patient ID: Lorraine Walker, female   DOB: 11-Sep-1995, 24 y.o.   MRN: [id number] Pt admitted to obs unit. Pt agitated referring to staff as pervert and "voilating her "fucking rights". Pt verbally threatening to sue everyone and the hospital. Pt wanted to keep a T-shirt she brought from home stating it's smells like her boyfriend and is a security item to keep her anxiety down. Pt refuse medication stating she does not want to be a "pill head". Patient anxious and paranoid about "people coming to get her". Wanted a room with TV or radio because she can not sleep in silence.
